# Patient Record
Sex: Female | Born: 1960 | Race: Black or African American | Hispanic: No | Marital: Married | Smoking: Never smoker
Health system: Southern US, Community
[De-identification: ages and names within clinical notes are randomized; demographics above are authoritative.]

## PROBLEM LIST (undated history)

## (undated) DIAGNOSIS — I10 Essential (primary) hypertension: Secondary | ICD-10-CM

## (undated) DIAGNOSIS — E669 Obesity, unspecified: Secondary | ICD-10-CM

## (undated) DIAGNOSIS — M1990 Unspecified osteoarthritis, unspecified site: Secondary | ICD-10-CM

## (undated) DIAGNOSIS — G4730 Sleep apnea, unspecified: Secondary | ICD-10-CM

## (undated) DIAGNOSIS — G4733 Obstructive sleep apnea (adult) (pediatric): Secondary | ICD-10-CM

## (undated) HISTORY — DX: Essential (primary) hypertension: I10

## (undated) HISTORY — DX: Sleep apnea, unspecified: G47.30

## (undated) HISTORY — DX: Unspecified osteoarthritis, unspecified site: M19.90

## (undated) HISTORY — DX: Obesity, unspecified: E66.9

## (undated) HISTORY — DX: Obstructive sleep apnea (adult) (pediatric): G47.33

## (undated) HISTORY — PX: TUBAL LIGATION: SHX77

---

## 2001-05-15 ENCOUNTER — Encounter: Admission: RE | Admit: 2001-05-15 | Discharge: 2001-05-15 | Payer: Self-pay | Admitting: Family Medicine

## 2001-05-15 ENCOUNTER — Encounter: Payer: Self-pay | Admitting: Family Medicine

## 2001-10-02 ENCOUNTER — Encounter: Payer: Self-pay | Admitting: Family Medicine

## 2001-10-02 ENCOUNTER — Encounter: Admission: RE | Admit: 2001-10-02 | Discharge: 2001-10-02 | Payer: Self-pay | Admitting: Family Medicine

## 2002-01-12 ENCOUNTER — Observation Stay (HOSPITAL_COMMUNITY): Admission: EM | Admit: 2002-01-12 | Discharge: 2002-01-13 | Payer: Self-pay | Admitting: *Deleted

## 2002-01-12 ENCOUNTER — Encounter: Payer: Self-pay | Admitting: *Deleted

## 2002-01-13 ENCOUNTER — Encounter: Payer: Self-pay | Admitting: Interventional Cardiology

## 2004-02-28 DIAGNOSIS — G4733 Obstructive sleep apnea (adult) (pediatric): Secondary | ICD-10-CM

## 2004-02-28 HISTORY — DX: Obstructive sleep apnea (adult) (pediatric): G47.33

## 2004-08-01 ENCOUNTER — Emergency Department (HOSPITAL_COMMUNITY): Admission: EM | Admit: 2004-08-01 | Discharge: 2004-08-01 | Payer: Self-pay | Admitting: Family Medicine

## 2004-08-18 ENCOUNTER — Ambulatory Visit: Payer: Self-pay | Admitting: Internal Medicine

## 2004-09-02 ENCOUNTER — Ambulatory Visit: Payer: Self-pay | Admitting: Internal Medicine

## 2004-09-12 ENCOUNTER — Ambulatory Visit (HOSPITAL_BASED_OUTPATIENT_CLINIC_OR_DEPARTMENT_OTHER): Admission: RE | Admit: 2004-09-12 | Discharge: 2004-09-12 | Payer: Self-pay | Admitting: Internal Medicine

## 2004-09-18 ENCOUNTER — Ambulatory Visit: Payer: Self-pay | Admitting: Internal Medicine

## 2004-09-19 ENCOUNTER — Encounter: Admission: RE | Admit: 2004-09-19 | Discharge: 2004-12-18 | Payer: Self-pay | Admitting: Internal Medicine

## 2004-10-17 ENCOUNTER — Ambulatory Visit (HOSPITAL_BASED_OUTPATIENT_CLINIC_OR_DEPARTMENT_OTHER): Admission: RE | Admit: 2004-10-17 | Discharge: 2004-10-17 | Payer: Self-pay | Admitting: Internal Medicine

## 2004-10-23 ENCOUNTER — Ambulatory Visit: Payer: Self-pay | Admitting: Internal Medicine

## 2004-12-14 ENCOUNTER — Ambulatory Visit: Payer: Self-pay | Admitting: Internal Medicine

## 2005-01-09 ENCOUNTER — Encounter (INDEPENDENT_AMBULATORY_CARE_PROVIDER_SITE_OTHER): Payer: Self-pay | Admitting: *Deleted

## 2005-01-09 ENCOUNTER — Ambulatory Visit: Payer: Self-pay | Admitting: Internal Medicine

## 2005-02-06 ENCOUNTER — Encounter (INDEPENDENT_AMBULATORY_CARE_PROVIDER_SITE_OTHER): Payer: Self-pay | Admitting: *Deleted

## 2005-02-06 ENCOUNTER — Ambulatory Visit (HOSPITAL_COMMUNITY): Admission: RE | Admit: 2005-02-06 | Discharge: 2005-02-06 | Payer: Self-pay | Admitting: Sports Medicine

## 2005-02-06 LAB — HM MAMMOGRAPHY: HM Mammogram: NORMAL

## 2005-04-13 ENCOUNTER — Ambulatory Visit: Payer: Self-pay | Admitting: Internal Medicine

## 2005-07-05 ENCOUNTER — Ambulatory Visit: Payer: Self-pay | Admitting: Internal Medicine

## 2005-08-21 ENCOUNTER — Ambulatory Visit: Payer: Self-pay | Admitting: Internal Medicine

## 2005-08-29 ENCOUNTER — Ambulatory Visit: Payer: Self-pay | Admitting: Internal Medicine

## 2005-12-27 DIAGNOSIS — I10 Essential (primary) hypertension: Secondary | ICD-10-CM

## 2006-02-04 DIAGNOSIS — J309 Allergic rhinitis, unspecified: Secondary | ICD-10-CM | POA: Insufficient documentation

## 2006-06-28 ENCOUNTER — Emergency Department (HOSPITAL_COMMUNITY): Admission: EM | Admit: 2006-06-28 | Discharge: 2006-06-28 | Payer: Self-pay | Admitting: Family Medicine

## 2006-10-18 ENCOUNTER — Ambulatory Visit: Payer: Self-pay | Admitting: *Deleted

## 2006-10-18 ENCOUNTER — Ambulatory Visit (HOSPITAL_COMMUNITY): Admission: RE | Admit: 2006-10-18 | Discharge: 2006-10-18 | Payer: Self-pay | Admitting: *Deleted

## 2006-10-18 LAB — CONVERTED CEMR LAB
BUN: 11 mg/dL (ref 6–23)
CO2: 25 meq/L (ref 19–32)
Calcium: 9.6 mg/dL (ref 8.4–10.5)
Chloride: 102 meq/L (ref 96–112)
Creatinine, Ser: 0.78 mg/dL (ref 0.40–1.20)
TSH: 2.183 microintl units/mL (ref 0.350–5.50)

## 2006-11-06 ENCOUNTER — Encounter (INDEPENDENT_AMBULATORY_CARE_PROVIDER_SITE_OTHER): Payer: Self-pay | Admitting: *Deleted

## 2006-11-06 ENCOUNTER — Ambulatory Visit (HOSPITAL_COMMUNITY): Admission: RE | Admit: 2006-11-06 | Discharge: 2006-11-06 | Payer: Self-pay | Admitting: *Deleted

## 2006-12-19 ENCOUNTER — Ambulatory Visit: Payer: Self-pay | Admitting: *Deleted

## 2006-12-19 LAB — CONVERTED CEMR LAB
Chloride: 100 meq/L (ref 96–112)
Potassium: 3.6 meq/L (ref 3.5–5.3)
Sodium: 135 meq/L (ref 135–145)

## 2007-04-10 ENCOUNTER — Ambulatory Visit: Payer: Self-pay | Admitting: *Deleted

## 2008-06-16 ENCOUNTER — Encounter (INDEPENDENT_AMBULATORY_CARE_PROVIDER_SITE_OTHER): Payer: Self-pay | Admitting: Internal Medicine

## 2008-06-23 ENCOUNTER — Ambulatory Visit: Payer: Self-pay | Admitting: Internal Medicine

## 2008-06-23 ENCOUNTER — Encounter (INDEPENDENT_AMBULATORY_CARE_PROVIDER_SITE_OTHER): Payer: Self-pay | Admitting: Internal Medicine

## 2008-06-23 ENCOUNTER — Ambulatory Visit (HOSPITAL_COMMUNITY): Admission: RE | Admit: 2008-06-23 | Discharge: 2008-06-23 | Payer: Self-pay | Admitting: Internal Medicine

## 2008-06-23 ENCOUNTER — Encounter (INDEPENDENT_AMBULATORY_CARE_PROVIDER_SITE_OTHER): Payer: Self-pay | Admitting: *Deleted

## 2008-06-23 LAB — CONVERTED CEMR LAB
ALT: 16 units/L (ref 0–35)
AST: 28 units/L (ref 0–37)
Albumin: 3.7 g/dL (ref 3.5–5.2)
CK-MB: 3.2 ng/mL (ref 0.3–4.0)
Calcium: 9.1 mg/dL (ref 8.4–10.5)
Chloride: 104 meq/L (ref 96–112)
Cholesterol: 150 mg/dL (ref 0–200)
Creatinine, Ser: 0.69 mg/dL (ref 0.40–1.20)
MCHC: 32.6 g/dL (ref 30.0–36.0)
MCV: 84.9 fL (ref 78.0–100.0)
Potassium: 3.8 meq/L (ref 3.5–5.3)
Pro B Natriuretic peptide (BNP): <30 pg/mL (ref 0.0–100.0)
RBC: 4.36 M/uL (ref 3.87–5.11)
Relative Index: 1.7 (ref 0.0–2.5)
Sodium: 137 meq/L (ref 135–145)
TSH: 2.322 microintl units/mL (ref 0.350–4.500)
VLDL: 14 mg/dL (ref 0–40)

## 2008-06-24 ENCOUNTER — Encounter (INDEPENDENT_AMBULATORY_CARE_PROVIDER_SITE_OTHER): Payer: Self-pay | Admitting: *Deleted

## 2008-06-29 ENCOUNTER — Encounter: Payer: Self-pay | Admitting: Internal Medicine

## 2008-06-29 ENCOUNTER — Ambulatory Visit: Payer: Self-pay | Admitting: Vascular Surgery

## 2008-06-29 ENCOUNTER — Ambulatory Visit (HOSPITAL_COMMUNITY): Admission: RE | Admit: 2008-06-29 | Discharge: 2008-06-29 | Payer: Self-pay | Admitting: Internal Medicine

## 2008-11-25 ENCOUNTER — Ambulatory Visit: Payer: Self-pay | Admitting: Internal Medicine

## 2008-11-25 LAB — CONVERTED CEMR LAB
Calcium: 9.3 mg/dL (ref 8.4–10.5)
Chloride: 105 meq/L (ref 96–112)
Creatinine, Ser: 0.83 mg/dL (ref 0.40–1.20)

## 2009-11-01 ENCOUNTER — Emergency Department (HOSPITAL_COMMUNITY): Admission: EM | Admit: 2009-11-01 | Discharge: 2009-11-01 | Payer: Self-pay | Admitting: Family Medicine

## 2009-11-01 ENCOUNTER — Emergency Department (HOSPITAL_COMMUNITY): Admission: EM | Admit: 2009-11-01 | Discharge: 2009-11-01 | Payer: Self-pay | Admitting: Emergency Medicine

## 2009-11-01 ENCOUNTER — Encounter: Payer: Self-pay | Admitting: Internal Medicine

## 2009-11-03 ENCOUNTER — Ambulatory Visit: Payer: Self-pay | Admitting: Internal Medicine

## 2009-11-04 ENCOUNTER — Encounter: Payer: Self-pay | Admitting: Internal Medicine

## 2009-11-05 ENCOUNTER — Encounter: Payer: Self-pay | Admitting: Internal Medicine

## 2009-11-05 LAB — CONVERTED CEMR LAB
Chloride: 102 meq/L (ref 96–112)
Potassium: 3.6 meq/L (ref 3.5–5.3)
Sodium: 139 meq/L (ref 135–145)

## 2009-12-28 ENCOUNTER — Telehealth: Payer: Self-pay | Admitting: *Deleted

## 2010-03-20 ENCOUNTER — Encounter: Payer: Self-pay | Admitting: *Deleted

## 2010-03-29 NOTE — Miscellaneous (Signed)
  Clinical Lists Changes   Orders: Added new Service order of Est. Patient Level III (99213) - Signed 

## 2010-03-29 NOTE — Progress Notes (Signed)
Summary: flu shot/ hla  Phone Note Call from Patient   Summary of Call: pt calls and states she got her flu shot 12/23/2009, [facility] [employer]. Initial call taken by: Marin Roberts RN,  December 28, 2009 11:10 AM

## 2010-03-29 NOTE — Assessment & Plan Note (Signed)
Summary: FU/SB.   Vital Signs:  Patient profile:   50 year old female Height:      71 inches (180.34 cm) Weight:      272.3 pounds (123.77 kg) BMI:     38.12 Temp:     98.3 degrees F (36.83 degrees C) oral Pulse rate:   79 / minute Pulse (ortho):   91 / minute BP supine:   120 / 80 BP sitting:   110 / 75  (left arm) BP standing:   109 / 72 Cuff size:   large  Vitals Entered By: Theotis Barrio NT II (November 03, 2009 11:02 AM)  Serial Vital Signs/Assessments:  Time      Position  BP       Pulse  Resp  Temp     By           Lying RA  120/80   79                    Kaye Goldston,CMA (AAMA)           Sitting   110/75   83                    Kaye Goldston,CMA (AAMA)           Standing  109/72   91                    Kaye Goldston,CMA (AAMA)  Comments: a little off balance sitting to standing By: Cynda Familia (AAMA)   CC: PATIENT IS TO GET FLU SHOT FREE AT WORK  /  HERE FOR ER FOLLOW UP APPT /  Is Patient Diabetic? Yes Pain Assessment Patient in pain? no      Nutritional Status BMI of > 30 = obese  Have you ever been in a relationship where you felt threatened, hurt or afraid?No   Does patient need assistance? Functional Status Self care Ambulation Normal Comments TO GET FLU SHOT FREE AT HER WORK  /  ER FOLLOW UP APPT   Primary Care Provider:  Blanch Media MD  CC:  PATIENT IS TO GET FLU SHOT FREE AT WORK  /  HERE FOR ER FOLLOW UP APPT / .  History of Present Illness: This is a 50 year old female with PMH consistent with Hypertension, Osteoarthritis, Obesity who presents after ED visit on 11/01/09 for dizziness, palpation and pre-syncope episode. While in ED cardiac enzymes, CXR, CBC, Bmet and EKG were unremarkable. She noted she has been in a lot of stress at work ( works at the [employer] in [place]) and feels exhausted. She also noted occasional palpation with and without dizziness. She did not have any pre-syncopial episodes since discharged from the  ED but mentioned he had some few episodes where she felt dizzy and noted some tightness in the chest. She denies any SOB, chestpain, fevers, feeling unusal hot or cold, diarrhea, constipation, nausea.  BP today on presentation 130/83, currently on HCTZ 25 once a day BP orthostatic lying: 120/80 p 79 sitting: 110/75 p 83 standing 109/72 p 91      Preventive Screening-Counseling & Management  Alcohol-Tobacco     Alcohol type: Wine or Brandy sometimes     Smoking Status: never  Caffeine-Diet-Exercise     Does Patient Exercise: no  Current Medications (verified): 1)  Hydrochlorothiazide 25 Mg Tabs (Hydrochlorothiazide) .... Take 1 Tablet By Mouth Once A Day 2)  Eq Omeprazole  20 Mg Tbec (Omeprazole) .... Take 1 Tablet By Mouth Once A Day 3)  Vicodin 5-500 Mg Tabs (Hydrocodone-Acetaminophen) .... Take Once Daily As Needed For Pain 4)  Viagra 50 Mg Tabs (Sildenafil Citrate) .... Take 1 Hr Prior To Sexual Activity  Allergies: No Known Drug Allergies  Review of Systems  The patient denies weight loss, weight gain, vision loss, chest pain, dyspnea on exertion, peripheral edema, headaches, abdominal pain, melena, and muscle weakness.         some indegestion/heartburn  Physical Exam  General:  Well-developed,well-nourished,in no acute distress; alert,appropriate and cooperative throughout examination Neck:  No deformities, masses, or tenderness noted. Lungs:  Normal respiratory effort, chest expands symmetrically. Lungs are clear to auscultation, no crackles or wheezes. Heart:  Normal rate and regular rhythm. S1 and S2 normal without gallop, murmur, click, rub or other extra sounds. Abdomen:  Bowel sounds positive,abdomen soft and non-tender without masses, organomegaly or hernias noted. Pulses:  R and L carotid,radial,dorsalis pedis and posterior tibial pulses are full and equal bilaterally Extremities:  No clubbing, cyanosis, edema, or deformity noted with normal full range of  motion of all joints.   Neurologic:  No cranial nerve deficits noted. Station and gait are normal. Plantar reflexes are down-going bilaterally. DTRs are symmetrical throughout. Sensory, motor and coordinative functions appear intact. Cervical Nodes:  No lymphadenopathy noted Psych:  Cognition and judgment appear intact. Alert and cooperative with normal attention span and concentration. No apparent delusions, illusions, hallucinations   Impression & Recommendations:  Problem # 1:  DIZZINESS (ICD-780.4)  Palpitation, dizziness and pre-syncope episodes: DD includes vasovagal, orthostatic hypotension, volume depletion with dehydration, anemia, electrolyte abnormalities, Hyperthyroidism (especially with history of palpation). Orthostatic BP has been normal, CBC was within normal limits, Bmet was normal except potassium of 3.4, ECG and cardiac enzymes were normal in the ED. Possible cause at the time of presentation to ED was volume depletion due to dehydration and stress. Recommend to reduce stress level as much as possible and increase fluid intake. Advised to follow up as soon as possible if symptoms persist otherwise follow up in three month.  We will consider to check TSH.  Orders: T-TSH ([id number])  Problem # 2:  HYPERTENSION (ICD-401.9) BP has been well controlled. 130/83  with puls of 88 and after rechecking 110/75  with a puls of 83. At this point I would not make any changes in her medication. I recommended to check it on a regular basis at home. Recheck Bmet since potassium was 3.4 and taking HCTZ.  Her updated medication list for this problem includes:    Hydrochlorothiazide 25 Mg Tabs (Hydrochlorothiazide) .Marland Kitchen... Take 1 tablet by mouth once a day  Orders: T-Basic Metabolic Panel [phone])  Problem # 3:  OSTEOARTHRITIS (ICD-715.90) She complaines about occasional pain especially in the knee. If the pain is severe she takes a vicodin which relieves the pain. Refilled her  prescription today.  Her updated medication list for this problem includes:    Vicodin 5-500 Mg Tabs (Hydrocodone-acetaminophen) .Marland Kitchen... Take once daily as needed for pain  Complete Medication List: 1)  Hydrochlorothiazide 25 Mg Tabs (Hydrochlorothiazide) .... Take 1 tablet by mouth once a day 2)  Eq Omeprazole 20 Mg Tbec (Omeprazole) .... Take 1 tablet by mouth once a day 3)  Vicodin 5-500 Mg Tabs (Hydrocodone-acetaminophen) .... Take once daily as needed for pain 4)  Viagra 50 Mg Tabs (Sildenafil citrate) .... Take 1 hr prior to sexual activity  Patient Instructions: 1)  Please schedule  a follow-up appointment in 3 months. Prescriptions: VICODIN 5-500 MG TABS (HYDROCODONE-ACETAMINOPHEN) take once daily as needed for pain  #30 x 1   Entered and Authorized by:   Almyra Deforest MD   Signed by:   Almyra Deforest MD on 11/03/2009   Method used:   Print then Give to Patient   RxID:   [id number] EQ OMEPRAZOLE 20 MG TBEC (OMEPRAZOLE) Take 1 tablet by mouth once a day  #30 x 5   Entered and Authorized by:   Almyra Deforest MD   Signed by:   Almyra Deforest MD on 11/03/2009   Method used:   Electronically to        [facility] [id number] (retail)       [address]       [place], [place]  [address]       Ph: [phone]       Fax: [phone]   RxID:   [id number]  Process Orders Check Orders Results:     [facility]: ABN not required for this insurance Tests Sent for requisitioning (November 03, 2009 1:46 PM):     11/03/2009: [facility] -- T-Basic Metabolic Panel [phone] (signed)     11/03/2009: [facility] -- T-TSH [phone] (signed)     Prevention & Chronic Care Immunizations   Influenza vaccine: Not documented    Tetanus booster: Not documented    Pneumococcal vaccine: Not documented  Colorectal Screening   Hemoccult: Not documented    Colonoscopy: Not documented  Other  Screening   Pap smear: Not documented    Mammogram: Normal  (02/06/2005)   Smoking status: never  (11/03/2009)  Lipids   Total Cholesterol: 150  (06/23/2008)   LDL: 85  (06/23/2008)   LDL Direct: Not documented   HDL: 51  (06/23/2008)   Triglycerides: 71  (06/23/2008)  Hypertension   Last Blood Pressure: 110 / 75  (11/03/2009)   Serum creatinine: 0.83  (11/25/2008)   Serum potassium 4.5  (11/25/2008)  Self-Management Support :   Personal Goals (by the next clinic visit) :      Personal blood pressure goal: 130/80  (11/03/2009)   Patient will work on the following items until the next clinic visit to reach self-care goals:     Medications and monitoring: take my medicines every day  (11/03/2009)     Eating: drink diet soda or water instead of juice or soda, eat more vegetables, eat foods that are low in salt, eat baked foods instead of fried foods, eat fruit for snacks and desserts, limit or avoid alcohol  (11/03/2009)    Hypertension self-management support: Resources for patients handout  (11/03/2009)      Resource handout printed.

## 2010-03-29 NOTE — Discharge Summary (Signed)
Summary: Emergency Department Discharge Update    Hospital Discharge Update:  Date of Admission: 11/01/2009 Date of Discharge: 11/01/2009  Brief Summary:  Patient was transferred from UC secondary to palpitations, dizziness and pre-syncope episodes. Symptoms has been present for the last week or so, but worse over the last 2-3 days. Patient reports a lot of stress at work and also increased in her duty hours due to short staff. (per husband that is the reason of her symptoms); she admitted exhaustion feeling over the last days due to too much work. While in ED Cardiac enzymes, CXR, CBC, BMET and EKG were unremarkable. The decision was to discharge her home with a close followup and further assessment of her symptoms as an outpatient. Patient will call the office tomorrow to arrange visit as her earliest convenience.  Lab or other results pending at discharge:  None  Other labs needed at follow-up: Would be helpful to check TSH and repeat BMET.  Other follow-up issues:  Patient BP has been not well controlled recently and she might need adjustment on her current medications or a new added drug for better control.  The medication, problem, and allergy lists have been updated.  Please see the dictated discharge summary for details.  Discharge medications:  HYDROCHLOROTHIAZIDE 25 MG TABS (HYDROCHLOROTHIAZIDE) Take 1 tablet by mouth once a day EQ OMEPRAZOLE 20 MG TBEC (OMEPRAZOLE) Take 1 tablet by mouth once a day VICODIN 5-500 MG TABS (HYDROCODONE-ACETAMINOPHEN) take once daily as needed for pain VIAGRA 50 MG TABS (SILDENAFIL CITRATE) take 1 hr prior to sexual activity  Other patient instructions:  1- Call the office tomorrow in order to schedule followup visit. 2- Follow a low sodium diet and keep yourself hydrated. 3- Take life easy and try not to overwhelmed yourself. 4- Take your medications as prescribed. 5- Labwork, EKG and image studies performed in the ED today were in normal  limits.  Note: Hospital Discharge Medications & Other Instructions handout was printed, one copy for patient and a second copy to be placed in hospital chart.

## 2010-03-29 NOTE — Letter (Signed)
Summary: Out of Work  [facility]  [address]   [place], [place] [address]   Phone: [phone]  Fax: [phone]    November 01, 2009   Employee:  JENNI THEW    To Whom It May Concern:     For Medical reasons, please excuse the above named employee from work for the following dates:  Start:   11/01/09  End:   11/03/09  If you need additional information, please feel free to contact our office.            Sincerely,    Vassie Loll MD

## 2010-04-22 ENCOUNTER — Encounter: Payer: Self-pay | Admitting: Internal Medicine

## 2010-04-22 DIAGNOSIS — E669 Obesity, unspecified: Secondary | ICD-10-CM

## 2010-04-22 DIAGNOSIS — J309 Allergic rhinitis, unspecified: Secondary | ICD-10-CM

## 2010-04-22 DIAGNOSIS — I10 Essential (primary) hypertension: Secondary | ICD-10-CM

## 2010-04-22 DIAGNOSIS — Z6837 Body mass index (BMI) 37.0-37.9, adult: Secondary | ICD-10-CM | POA: Insufficient documentation

## 2010-04-22 DIAGNOSIS — G4733 Obstructive sleep apnea (adult) (pediatric): Secondary | ICD-10-CM

## 2010-04-22 DIAGNOSIS — M1990 Unspecified osteoarthritis, unspecified site: Secondary | ICD-10-CM

## 2010-05-12 LAB — DIFFERENTIAL
Eosinophils Relative: 1 % (ref 0–5)
Lymphocytes Relative: 42 % (ref 12–46)
Lymphs Abs: 2.2 10*3/uL (ref 0.7–4.0)
Monocytes Absolute: 0.3 10*3/uL (ref 0.1–1.0)

## 2010-05-12 LAB — BASIC METABOLIC PANEL
Chloride: 103 mEq/L (ref 96–112)
GFR calc Af Amer: >60 mL/min (ref >60)
Potassium: 3.4 mEq/L — ABNORMAL LOW (ref 3.5–5.1)
Sodium: 139 mEq/L (ref 135–145)

## 2010-05-12 LAB — POCT CARDIAC MARKERS
CKMB, poc: 1.3 ng/mL (ref 1.0–8.0)
Troponin i, poc: <0.05 ng/mL (ref 0.00–0.09)

## 2010-05-12 LAB — CBC
HCT: 38.6 % (ref 36.0–46.0)
MCV: 86.5 fL (ref 78.0–100.0)
RBC: 4.46 MIL/uL (ref 3.87–5.11)
WBC: 5.2 10*3/uL (ref 4.0–10.5)

## 2010-07-15 NOTE — Procedures (Signed)
NAME:  Emily Schmitt, Emily Schmitt                ACCOUNT NO.:  [id number]   MEDICAL RECORD NO.:  [id number]          PATIENT TYPE:  OUT   LOCATION:  SLEEP CENTER                 FACILITY:  [facility]   PHYSICIAN:  Clinton D. Maple Hudson, M.D. DATE OF BIRTH:  1960-05-14   DATE OF STUDY:  09/12/2004                              NOCTURNAL POLYSOMNOGRAM   REFERRING PHYSICIAN:  Dr. Duncan Dull   DATE OF STUDY:  September 12, 2004   INDICATION FOR STUDY:  Hypersomnia with sleep apnea.  Epworth Sleepiness  Score 9/24, BMI 43, weight 310 pounds.   SLEEP ARCHITECTURE:  Total sleep time 425 minutes with sleep efficiency 96%.  Stage I 5%, stage II 56%, stages III and IV 4%, REM 35% of total sleep time.  Sleep latency 7 minutes, REM latency 43 minutes, awake after sleep onset 10  minutes, arousal index 30.  No bedtime medication taken.   RESPIRATORY DATA:  Split-study protocol.  Respiratory Disturbance Index  (RDI, AHI) 145.6 obstructive events per hour indicating very severe  obstructive sleep apnea/hypopnea syndrome before CPAP.  There were 313  obstructive apneas, 1 central apnea, 1 mixed apnea and 4 hypopneas before  CPAP.  She slept only supine.  REM RDI 34.  CPAP was titrated to 11 CWP, RDI  7.1 per hour, using a small Respironics ComfortGel Nasal Mask with heated  humidifier.   OXYGEN DATA:  Moderately loud snoring with oxygen desaturation to a nadir of  62% before CPAP.  After CPAP control saturation held 94-96% on room air.   CARDIAC DATA:  Normal sinus rhythm.   MOVEMENT/PARASOMNIA:  Very frequent limb jerks but only a few were  associated with sleep disturbance, probably insignificant but can be  reevaluated if necessary after sleep apnea is treated.   IMPRESSION/RECOMMENDATION:  1.  Very severe obstructive sleep apnea/hypopnea syndrome, Respiratory      Disturbance Index 145.6 per hour, with moderately loud snoring and      oxygen desaturation to 62%.  2.  Successful continuous positive airway pressure  titration to 11 CWP,      Respiratory Disturbance Index 7.1 per hour, using a small Respironics      ComfortGel Nasal Mask and heated humidifier.      Clinton D. Maple Hudson, M.D.  Diplomat    CDY/MEDQ  D:  09/18/2004 11:08:58  T:  09/18/2004 22:59:13  Job:  [id number]

## 2010-07-15 NOTE — Procedures (Signed)
NAME:  Emily Schmitt, Emily Schmitt                ACCOUNT NO.:  [id number]   MEDICAL RECORD NO.:  [id number]          PATIENT TYPE:  OUT   LOCATION:  SLEEP CENTER                 [facility]ACILITY:  [facility]   PHYSICIAN:  Clinton D. Maple Hudson, M.D. DATE OF BIRTH:  07-02-1960   DATE OF STUDY:  10/17/2004                              NOCTURNAL POLYSOMNOGRAM   REFERRING PHYSICIAN:  Dr. Duncan Dull   DATE OF STUDY:  October 17, 2004   INDICATION FOR STUDY:  Hypersomnia with sleep apnea. Epworth Sleepiness  Score 9/24, BMI 43, weight 310 pounds. A baseline diagnostic study was done  September 12, 2004 recording an RDI of 145 per hour. Split protocol CPAP  titration on that study night to a CPAP of 11 CWP gave an RDI of 7.1 per  hour. Repeat CPAP titration is requested.   SLEEP ARCHITECTURE:  Total sleep time 372 minutes with sleep efficiency 98%.  Stage I was 1%, stage II 47%, stages III and IV were absent, REM 52% of  total sleep time indicating rebound. Sleep latency was 6 minutes, REM  latency 33 minutes, awake after sleep onset 1.5 minutes, arousal index 8. No  bedtime medication recorded.   RESPIRATORY DATA:  CPAP titration to 14 CWP, AHI 0.7 per hour using a medium  ComfortGel Mask with heated humidifier.   OXYGEN DATA:  Baseline snoring was very loud. Snoring was eliminated at a  CPAP pressure of 14 CWP with oxygen saturation on CPAP 95-97% room air.   CARDIAC DATA:  Normal sinus rhythm.   MOVEMENT/PARASOMNIA:  Occasional leg jerks with little effect on sleep.   IMPRESSION/RECOMMENDATION:  1.  Successful continuous positive airway pressure titration to 14 CWP,      apnea-hypopnea index 0.7 per hour using a medium ComfortGel Mask with      heated humidifier.  2.  Baseline diagnostic NPSG on September 12, 2004 had recorded an apnea-hypopnea      index of 145 per hour which is unusually severe.  3.  Rapid eye movement rebound indicating successful correction of impaired      sleep quality is noted on the current  study.      Clinton D. Maple Hudson, M.D.  Diplomate, Biomedical engineer of Sleep Medicine  Electronically Signed     CDY/MEDQ  D:  10/23/2004 09:37:52  T:  10/23/2004 14:21:18  Job:  [id number]

## 2010-07-15 NOTE — H&P (Signed)
NAME:  Emily Schmitt, Emily Schmitt                          ACCOUNT NO.:  [id number]   MEDICAL RECORD NO.:  [id number]                   PATIENT TYPE:  INP   LOCATION:  2019                                 FACILITY:  [facility]   PHYSICIAN:  Lesleigh Noe, M.D.            DATE OF BIRTH:  08/24/1960   DATE OF ADMISSION:  01/12/2002  DATE OF DISCHARGE:                                HISTORY & PHYSICAL   SUBJECTIVE:  The patient is 50 years of age and has a history of  hypertension.  She was admitted because of a 2-3 day history of recurring  stress/emotion-related right and precordial chest discomfort (and ache),  associated with some numbness and tingling in the right arm.  The discomfort  is not associated with dyspnea, diaphoresis, exertion, or other complaints.  The duration of the discomfort is usually less than 10 minutes.   HABITS:  Negative smoking, positive social ETOH.   PAST MEDICAL HISTORY:  1. Hypertension.  2. Obesity.   FAMILY HISTORY:  Sister has hole in the heart.  Mother has diabetes.   ALLERGIES:  None.   MEDICATIONS:  None.  Was on antihypertensive therapy but discontinued this.   REVIEW OF SYSTEMS:  Positive for occasional reflux-like symptoms.  Negative  neurological symptoms.  Negative cervical and lower back discomfort.  Weight  has been stable.  No chills, no fever.  Exertional tolerance is adequate.  She works in housekeeping at [employer] and is able to do her  job without physical limitations.   OBJECTIVE:  VITAL SIGNS:  Blood pressure 160/70, heart rate 80, weight 267  pounds, respirations 16 and nonlabored, and afebrile.  SKIN:  Clear.  No obvious conjunctival pallor or other skin abnormalities.  HEENT:  Unremarkable.  Extraocular movements are full.  NECK:  No JVD, carotid bruits, or thyromegaly.  LUNGS:  Clear to auscultation and percussion.  CARDIAC EXAM:  No click, no rub, no murmur, no gallop.  ABDOMEN:  Soft.  Liver and spleen are not  palpable.  Bowel sounds are  normal.  EXTREMITIES:  No edema.  Pulses are 2+ and symmetric in the upper and lower  extremities.  NEUROLOGICAL:  Normal.  No motor or sensory deficits are noted.   DATA:  An EKG is normal.  Potassium 3.3.  CK-MB negative x1.  Troponin-I  negative x2.  Chest x-ray:  Result pending.   ASSESSMENT:  1. Chest discomfort, atypical.  The patient has risk factor including     obesity and hypertension.  Myocardial infarction has been ruled out.     Rule out coronary artery disease.  2. Obesity.  3. History of hypertension, not currently on therapy.  4. Hypokalemia.   PLAN:  1. Adenosine Cardiolite.  2. Replete potassium.  3. Will likely need to resume antihypertensive therapy.  4. Further workup as an outpatient if Cardiolite is negative.  Lesleigh Noe, M.D.    HWS/MEDQ  D:  01/13/2002  T:  01/13/2002  Job:  [id number]   cc:   Milbert Coulter, M.D.  [facility]  [place]

## 2012-01-10 ENCOUNTER — Emergency Department (HOSPITAL_COMMUNITY): Payer: BC Managed Care – PPO

## 2012-01-10 ENCOUNTER — Encounter (HOSPITAL_COMMUNITY): Payer: Self-pay | Admitting: Emergency Medicine

## 2012-01-10 ENCOUNTER — Observation Stay (HOSPITAL_COMMUNITY)
Admission: EM | Admit: 2012-01-10 | Discharge: 2012-01-11 | Disposition: A | Discharge status: Home / Self Care | Payer: BC Managed Care – PPO | Attending: Emergency Medicine | Admitting: Emergency Medicine

## 2012-01-10 DIAGNOSIS — R0602 Shortness of breath: Secondary | ICD-10-CM | POA: Insufficient documentation

## 2012-01-10 DIAGNOSIS — R0789 Other chest pain: Secondary | ICD-10-CM | POA: Diagnosis present

## 2012-01-10 DIAGNOSIS — I498 Other specified cardiac arrhythmias: Secondary | ICD-10-CM | POA: Insufficient documentation

## 2012-01-10 DIAGNOSIS — I10 Essential (primary) hypertension: Secondary | ICD-10-CM | POA: Insufficient documentation

## 2012-01-10 DIAGNOSIS — IMO0002 Reserved for concepts with insufficient information to code with codable children: Secondary | ICD-10-CM | POA: Insufficient documentation

## 2012-01-10 DIAGNOSIS — I471 Supraventricular tachycardia: Secondary | ICD-10-CM | POA: Diagnosis not present

## 2012-01-10 DIAGNOSIS — M549 Dorsalgia, unspecified: Secondary | ICD-10-CM | POA: Insufficient documentation

## 2012-01-10 DIAGNOSIS — Z6837 Body mass index (BMI) 37.0-37.9, adult: Secondary | ICD-10-CM | POA: Diagnosis present

## 2012-01-10 DIAGNOSIS — G4733 Obstructive sleep apnea (adult) (pediatric): Secondary | ICD-10-CM | POA: Diagnosis present

## 2012-01-10 DIAGNOSIS — Z79899 Other long term (current) drug therapy: Secondary | ICD-10-CM | POA: Insufficient documentation

## 2012-01-10 DIAGNOSIS — Z8709 Personal history of other diseases of the respiratory system: Secondary | ICD-10-CM | POA: Insufficient documentation

## 2012-01-10 DIAGNOSIS — R079 Chest pain, unspecified: Principal | ICD-10-CM | POA: Insufficient documentation

## 2012-01-10 DIAGNOSIS — E669 Obesity, unspecified: Secondary | ICD-10-CM | POA: Insufficient documentation

## 2012-01-10 DIAGNOSIS — Z8669 Personal history of other diseases of the nervous system and sense organs: Secondary | ICD-10-CM | POA: Insufficient documentation

## 2012-01-10 DIAGNOSIS — M1710 Unilateral primary osteoarthritis, unspecified knee: Secondary | ICD-10-CM | POA: Insufficient documentation

## 2012-01-10 LAB — POCT I-STAT TROPONIN I
Troponin i, poc: 0 ng/mL (ref 0.00–0.08)
Troponin i, poc: 0 ng/mL (ref 0.00–0.08)
Troponin i, poc: 0 ng/mL (ref 0.00–0.08)

## 2012-01-10 LAB — BASIC METABOLIC PANEL
BUN: 14 mg/dL (ref 6–23)
CO2: 29 mEq/L (ref 19–32)
Calcium: 9.8 mg/dL (ref 8.4–10.5)
Chloride: 100 mEq/L (ref 96–112)
Creatinine, Ser: 0.68 mg/dL (ref 0.50–1.10)
GFR calc Af Amer: >90 mL/min (ref >90)
GFR calc non Af Amer: >90 mL/min (ref >90)
Glucose, Bld: 119 mg/dL — ABNORMAL HIGH (ref 70–99)
Potassium: 4.1 mEq/L (ref 3.5–5.1)
Sodium: 137 mEq/L (ref 135–145)

## 2012-01-10 LAB — CBC
HCT: 40.2 % (ref 36.0–46.0)
Hemoglobin: 13.8 g/dL (ref 12.0–15.0)
RDW: 13.3 % (ref 11.5–15.5)
WBC: 5.2 10*3/uL (ref 4.0–10.5)

## 2012-01-10 MED ORDER — ASPIRIN EC 325 MG PO TBEC
325.0000 mg | DELAYED_RELEASE_TABLET | Freq: Once | ORAL | Status: AC
  Administered 2012-01-10: 325 mg via ORAL
  Filled 2012-01-10: qty 1

## 2012-01-10 NOTE — ED Notes (Signed)
Pt eating sub from [place] that her family brought her.

## 2012-01-10 NOTE — ED Notes (Signed)
Pt c/o midsternal CP through to back x 2 days with dizziness and SOB; pt sts worse with exertion

## 2012-01-10 NOTE — ED Provider Notes (Signed)
Medical screening examination/treatment/procedure(s) were performed by non-physician practitioner and as supervising physician I was immediately available for consultation/collaboration.   David H Yao, MD 01/10/12 2329 

## 2012-01-10 NOTE — ED Provider Notes (Signed)
History     CSN: [id number]  Arrival date & time 01/10/12  1724   First MD Initiated Contact with Patient 01/10/12 1958      Chief Complaint  Patient presents with  . Chest Pain  . Back Pain    (Consider location/radiation/quality/duration/timing/severity/associated sxs/prior treatment) The history is provided by the patient.  Emily Schmitt is a 51 y.o. female hx of HTN, obesity here with chest pain, lightheadedness, SOB. Substernal chest pain that is intermittent and lasting 15 minutes for the last 2 days. She has SOB and lightheadedness associated with the symptoms. Denies any diaphoresis. Symptoms worse with exertion. No fever or cough. Had stress test several years ago. Never smoker.    Past Medical History  Diagnosis Date  . Hypertension   . OSA (obstructive sleep apnea) 2006    Dx 7/06 with RDI 145.6 and desat to 62%. CPAP at 11 reduced RDI to 7.1. Repeat testing Aug 2006 showed RDI 0.7 on CPAP of 0.7  . Obesity   . Osteoarthritis     Of knees B. Uses hydrocodone sparingly.   . Allergic rhinitis   . Osteoarthritis     Past Surgical History  Procedure Date  . Tubal ligation     History reviewed. No pertinent family history.  History  Substance Use Topics  . Smoking status: Never Smoker   . Smokeless tobacco: Never Used  . Alcohol Use: Yes    OB History    Grav Para Term Preterm Abortions TAB SAB Ect Mult Living                  Review of Systems  Respiratory: Positive for shortness of breath.   Cardiovascular: Positive for chest pain.  All other systems reviewed and are negative.    Allergies  Review of patient's allergies indicates no known allergies.  Home Medications   Current Outpatient Rx  Name  Route  Sig  Dispense  Refill  . HYDROCHLOROTHIAZIDE 25 MG PO TABS   Oral   Take 25 mg by mouth daily.             BP 151/76  Pulse 94  Temp 98.7 F (37.1 C) (Oral)  Resp 18  SpO2 99%  LMP 12/10/2011  Physical Exam  Nursing note and  vitals reviewed. Constitutional: She is oriented to person, place, and time. She appears well-developed and well-nourished.       NAD   HENT:  Head: Normocephalic.  Mouth/Throat: Oropharynx is clear and moist.  Eyes: Conjunctivae normal are normal. Pupils are equal, round, and reactive to light.  Neck: Normal range of motion. Neck supple.  Cardiovascular: Normal rate, regular rhythm and normal heart sounds.   Pulmonary/Chest: Effort normal and breath sounds normal. No respiratory distress. She has no wheezes. She has no rales.  Abdominal: Soft. Bowel sounds are normal. She exhibits no distension. There is no tenderness. There is no rebound.  Musculoskeletal: Normal range of motion. She exhibits no edema and no tenderness.  Neurological: She is alert and oriented to person, place, and time.  Skin: Skin is warm and dry.  Psychiatric: She has a normal mood and affect. Her behavior is normal. Judgment and thought content normal.    ED Course  Procedures (including critical care time)  Labs Reviewed  BASIC METABOLIC PANEL - Abnormal; Notable for the following:    Glucose, Bld 119 (*)     All other components within normal limits  CBC  D-DIMER, QUANTITATIVE  POCT I-STAT  TROPONIN I   Dg Chest 2 View  01/10/2012  *RADIOLOGY REPORT*  Clinical Data: Chest pain, shortness of breath  CHEST - 2 VIEW  Comparison: Chest x-ray of 11/01/2009  Findings: No active infiltrate or effusion is seen.  Mediastinal contours appear stable.  The heart is within upper limits of normal and stable.  No bony abnormality is seen.  IMPRESSION: No active lung disease.   Original Report Authenticated By: Dwyane Dee, M.D.      No diagnosis found.   Date: 01/10/2012  Rate: 89  Rhythm: normal sinus rhythm  QRS Axis: right  Intervals: normal  ST/T Wave abnormalities: normal  Conduction Disutrbances:none  Narrative Interpretation:   Old EKG Reviewed: none available    MDM  [name] is a 51 y.o. female  here with chest pain. Trop neg x 1. CXR nl. Will transfer to CDU for chest pain protocol. She will get stress test in AM.          Richardean Canal, MD 01/10/12 2029

## 2012-01-10 NOTE — ED Provider Notes (Signed)
Patient arrived to the CDU for chest pain protocol. On evaluation patient reports she is currently pain-free patient reports she had some pain prior to coming over that after she was given aspirin the pain went completely away. Patient reports a past medical history of hypertension. Patient  had chest pain for the past 2 days prior to treatment in the emergency department she reports she has experienced some shortness of breath and some dizziness with exertion on physical exam. On physical exam vital signs are stable patient's blood pressure is 120/74 heart rate is 70 patient is resting comfortably she is alert and oriented patient's lungs are clear heart is a regular rate and rhythm abdomen is soft and nontender we will continue to monitor patient until a.m. stress test.   Elson Areas, PA 01/10/12 2203

## 2012-01-11 ENCOUNTER — Encounter (HOSPITAL_COMMUNITY): Payer: Self-pay | Admitting: Physician Assistant

## 2012-01-11 ENCOUNTER — Other Ambulatory Visit: Payer: Self-pay

## 2012-01-11 DIAGNOSIS — I471 Supraventricular tachycardia, unspecified: Secondary | ICD-10-CM

## 2012-01-11 DIAGNOSIS — R072 Precordial pain: Secondary | ICD-10-CM

## 2012-01-11 DIAGNOSIS — R0789 Other chest pain: Secondary | ICD-10-CM | POA: Diagnosis present

## 2012-01-11 LAB — POCT I-STAT TROPONIN I: Troponin i, poc: 0.01 ng/mL (ref 0.00–0.08)

## 2012-01-11 MED ORDER — METOPROLOL SUCCINATE ER 50 MG PO TB24: 50.0000 mg | ORAL_TABLET | Freq: Every day | ORAL | Status: DC

## 2012-01-11 NOTE — Progress Notes (Signed)
Utilization review completed.  P.J. Yandiel Bergum,RN,BSN Case Manager [phone]  

## 2012-01-11 NOTE — Consult Note (Signed)
CARDIOLOGY CONSULT NOTE   Patient ID: Emily Schmitt MRN: [id number] DOB/AGE: 1961-01-10 51 y.o.  Admit date: 01/10/2012  Primary Physician   Blanch Media, MD Primary Cardiologist   patient saw Garnette Scheuermann 10 years ago, wants to see [place] now Reason for Consultation   chest pain, SVT  Emily Schmitt is a 51 y.o. female with no history of CAD. She had palpitations and chest pain 10 years ago, was admitted overnight and had a negative stress test. In 2011, she had an episode of palpitations and near syncope, symptoms resolved and she was discharged from the emergency room. 4 days ago, she began having symptoms again. She has had multiple episodes of tachycardia palpitations that began generally while she is walking or climbing stairs. She can ameliorate the symptoms by sitting and resting. If she is not able to sit down and rest, she will develop presyncope, substernal chest pain, shortness of breath and diaphoresis. The symptoms resolve spontaneously in approximately 5 minutes or less. The episodes have increased in frequency and duration over the last few days. She had at least 5 episodes yesterday. She denies any precipitating symptoms but states that they have only occurred while she is walking or exerting herself in some way.  Ms. Shetler came to the emergency room last p.m. because she was concerned about the increasing frequency of her symptoms. She ruled out for MI with serial troponins. She was scheduled for a stress echocardiogram. During the test, she went into SVT which reproduced her symptoms. The test was completed, results are pending. After she got off the treadmill, she spontaneously converted to sinus rhythm. Valsalva or adenosine was not needed. When she converted to sinus rhythm, her chest pain decreased and finally resolved. Currently she is resting comfortably.  Past Medical History  Diagnosis Date  . Hypertension   . OSA (obstructive sleep apnea) 2006    Dx 7/06  with RDI 145.6 and desat to 62%. CPAP at 11 reduced RDI to 7.1. Repeat testing Aug 2006 showed RDI 0.7 on CPAP of 0.7  . Obesity   . Osteoarthritis     Of knees B. Uses hydrocodone sparingly.   . Allergic rhinitis   . Osteoarthritis      Past Surgical History  Procedure Date  . Tubal ligation     No Known Allergies  I have reviewed the patient's current medications    . [COMPLETED] aspirin EC  325 mg Oral Once    Medication Sig  hydrochlorothiazide 25 MG tablet Take 25 mg by mouth daily.       History   Social History  . Marital Status: Married    Spouse Name: N/A    Number of Children: N/A  . Years of Education: N/A   Occupational History  . Housekeeping supervisor [employer]   Social History Main Topics  . Smoking status: Never Smoker   . Smokeless tobacco: Never Used  . Alcohol Use: Yes - does not abuse   . Drug Use: No  . Sexually Active:    Family Status  Relation Status Death Age  . Sister Alive     Heart murmur  . Mother Deceased 33    Possible CVA, no CAD  . Father Deceased Late 28s    No cardiac issues  . Brother Alive     No cardiac issues    ROS: She does not abuse alcohol or drugs. She does not feel that she has been under an unusual amount of  stress recently. She has had no recent illnesses, fevers or chills. She does not have a low pressure cuff and does not check her blood pressure at home, but is compliant with her medications. Full 14 point review of systems complete and found to be negative unless listed above.  Physical Exam: Blood pressure 131/70, pulse 79, temperature 97.3 F (36.3 C), temperature source Oral, resp. rate 20, last menstrual period 12/10/2011, SpO2 99.00%.  General: Well developed, well nourished, female in no acute distress Head: Eyes PERRLA, No xanthomas.   Normocephalic and atraumatic, oropharynx without edema or exudate. Dentition: poor Lungs: Clear bilaterally Heart: HRRR S1 S2, no rub/gallop,no  significant murmur. pulses are 2+ all 4 extrem.   Neck: No carotid bruits. No lymphadenopathy.  JVD not elevated. Abdomen: Bowel sounds present, abdomen soft and non-tender without masses or hernias noted. Msk:  No spine or cva tenderness. No weakness, no joint deformities or effusions. Extremities: No clubbing or cyanosis. no edema.  Neuro: Alert and oriented X 3. No focal deficits noted. Psych:  Good affect, responds appropriately Skin: No rashes or lesions noted.  Labs:   Lab Results  Component Value Date   WBC 5.2 01/10/2012   HGB 13.8 01/10/2012   HCT 40.2 01/10/2012   MCV 88.0 01/10/2012   PLT 245 01/10/2012    Lab 01/10/12 1735  NA 137  K 4.1  CL 100  CO2 29  BUN 14  CREATININE 0.68  CALCIUM 9.8  PROT --  BILITOT --  ALKPHOS --  ALT --  AST --  GLUCOSE 119*    Basename 01/11/12 0226 01/10/12 2346  TROPIPOC 0.01 0.00   Lab Results  Component Value Date   DDIMER <0.27 01/10/2012    Myoview: 01/13/2002 IMPRESSION 1. BREAST ATTENUATION OF THE ANTERIOR WALL. NO EVIDENCE OF INFARCTION OR  ISCHEMIA. 2. ESTIMATED EJECTION FRACTION 63%.  Echo: 11/06/2006 SUMMARY - Overall left ventricular systolic function was normal. Left ventricular ejection fraction was estimated to be 60 %. Left ventricular wall thickness was mildly increased. - There was mild mitral valvular regurgitation. - Pericardial fat pad noted.  ECG:  10-Jan-2012 17:29:01 [facility]/ED ROUTINE RECORD Normal sinus rhythm Rightward axis Borderline ECG 12mm/s 67mm/mV 100Hz  8.0.1 12SL 241 HD CID: 3 Referred by: Unconfirmed Vent. rate 89 BPM PR interval 144 ms QRS duration 78 ms QT/QTc 368/447 ms P-R-T axes 68 92 29  Radiology:  Dg Chest 2 View 01/10/2012  *RADIOLOGY REPORT*  Clinical Data: Chest pain, shortness of breath  CHEST - 2 VIEW  Comparison: Chest x-ray of 11/01/2009  Findings: No active infiltrate or effusion is seen.  Mediastinal contours appear stable.  The heart  is within upper limits of normal and stable.  No bony abnormality is seen.  IMPRESSION: No active lung disease.   Original Report Authenticated By: Dwyane Dee, M.D.     ASSESSMENT AND PLAN:   The patient was seen today by Dr Shirlee Latch, the patient evaluated and the data reviewed.  Principal Problem:  *SVT (supraventricular tachycardia) - she has had a recent onset of symptoms with multiple episodes. She had recurrence of SVT with associated chest pain and shortness of breath while on the treadmill. Her symptoms resolve spontaneously. M.D. advise on admission, versus initiation of beta blocker and outpatient monitoring.  Active Problems:  Chest pain, mid sternal - this only occurs in the setting of palpitations. She had ruled out for MI, but has multiple cardiac risk factors. M.D. review ECGs/images from stress testing and advise  if further workup needed.   HYPERTENSION- sub-optimal control, would benefit from addition of BB.   OSA (obstructive sleep apnea) - continue CPAP   Obesity - Encourage heart-healthy diet.  Signed: Theodore Demark 01/11/2012, 10:17 AM Co-Sign MD  Patient seen with PA, agree with the above note.  I reviewed her stress echo.  There was no evidence for ischemia or infarction on echo images but she did go into a regular SVT with a rate around 200 during exercise.  This spontaneously converted back to NSR.  Her symptoms prior to admission were heart racing with associated lightheadedness and mild chest pain.  She had multiple episodes over the last few days.  She had similar symptoms about 2 years ago but nothing was ever diagnosed.  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  - Start Toprol XL 50 mg daily (BP is high at baseline).  - Avoid caffeine (drinks 2 cups of coffee and several soft drinks daily).  - Check TSH.  - I will put her on a 3 wk event monitor.  If she continues to have symptomatic SVT despite Toprol  XL use, will need to consider EP consult for ablation.  - I will see her in the office in a couple of weeks.   Marca Ancona 01/11/2012 10:39 AM

## 2012-01-11 NOTE — Progress Notes (Signed)
  Echocardiogram Echocardiogram Stress Test has been performed.  Emily Schmitt 01/11/2012, 10:49 AM

## 2012-01-11 NOTE — ED Notes (Signed)
MD at bedside. 

## 2012-01-11 NOTE — ED Notes (Signed)
Pt is back in room from Stress Test

## 2012-01-11 NOTE — ED Notes (Addendum)
Spoke with Echo/ stated that they were on their way to get patient for test

## 2012-01-11 NOTE — ED Notes (Addendum)
Pt transported to Echo / husband and friend is waiting for patient in her room

## 2012-01-11 NOTE — ED Provider Notes (Signed)
8:11 AM Handoff from Dr. Nicanor Alcon.    Patient on CPP, plan for exercise stress test.   Patient not in room at current time, she is getting stress test. Labs reviewed from overnight. Neg trop x 4.   9:34 AM Patient returned from stress echo. Received report patient had SVT during test and it was stopped. Upon return to CDU, patient in NSR rate 95-100.    Date: 01/11/2012  Rate: 94  Rhythm: normal sinus rhythm  QRS Axis: normal  Intervals: normal  ST/T Wave abnormalities: nonspecific T wave changes  Conduction Disutrbances:none  Narrative Interpretation:   Old EKG Reviewed: unchanged  Spoke with cardiology PA. Awaiting consult.   10:27 AM Dr. Shirlee Latch has seen. Will d/c to home on metoprolol, event monitor. Patient to follow-up in office.   Renne Crigler, [place] 01/11/12 1255

## 2012-01-12 NOTE — ED Provider Notes (Signed)
Medical screening examination/treatment/procedure(s) were performed by non-physician practitioner and as supervising physician I was immediately available for consultation/collaboration.  Damaso Laday R. Olita Takeshita, MD 01/12/12 1058 

## 2012-01-15 ENCOUNTER — Telehealth: Payer: Self-pay | Admitting: Cardiology

## 2012-01-15 DIAGNOSIS — I471 Supraventricular tachycardia: Secondary | ICD-10-CM

## 2012-01-15 NOTE — Telephone Encounter (Signed)
Pt should go ahead get event monitor prior to appt with Dr Shirlee Latch 02/16/12. Pt advised, verbalized understanding.

## 2012-01-15 NOTE — Telephone Encounter (Signed)
Pt calling re heart monitor, she didn't know if she was get it now or when she see's him 12-20, pls call

## 2012-01-16 DIAGNOSIS — I471 Supraventricular tachycardia: Secondary | ICD-10-CM

## 2012-02-08 ENCOUNTER — Encounter: Payer: Self-pay | Admitting: *Deleted

## 2012-02-16 ENCOUNTER — Ambulatory Visit (INDEPENDENT_AMBULATORY_CARE_PROVIDER_SITE_OTHER): Payer: BC Managed Care – PPO | Admitting: Cardiology

## 2012-02-16 ENCOUNTER — Encounter: Payer: Self-pay | Admitting: Cardiology

## 2012-02-16 VITALS — BP 146/94 | HR 96 | Ht 71.5 in | Wt 311.2 lb

## 2012-02-16 DIAGNOSIS — I498 Other specified cardiac arrhythmias: Secondary | ICD-10-CM

## 2012-02-16 DIAGNOSIS — I471 Supraventricular tachycardia: Secondary | ICD-10-CM

## 2012-02-16 MED ORDER — METOPROLOL SUCCINATE ER 50 MG PO TB24: 50.0000 mg | ORAL_TABLET | Freq: Every day | ORAL | Status: DC

## 2012-02-16 NOTE — Patient Instructions (Addendum)
Your physician wants you to follow-up in: 6 months with Dr McLean. (June 2014). You will receive a reminder letter in the mail two months in advance. If you don't receive a letter, please call our office to schedule the follow-up appointment.  

## 2012-02-19 NOTE — Progress Notes (Signed)
Patient ID: Emily Schmitt, female   DOB: [birth year], 51 y.o.   MRN: [id number] PCP: Dr. Rogelia Boga  51 yo with history of HTN presents for evaluation of SVT.  Patient developed a sensation of racing heart rate associated with chest tightness in 11/13.  She went to the ER and was in NSR but symptoms had resolved.  She had a stress echo the next day.  There was no evidence for ischemia or infarction, but while on the treadmill, she developed regular SVT that reproduced her prior symptoms.  She was started on Toprol XL and has had no symptomatic recurrence.  She is avoiding caffeine.  No exertional dyspnea or chest pain. TSH was normal.  She wore a 3 week monitor while on Toprol XL.  No significant arrhythmias were noted.   ECG: NSR, LAE, QTc 477 msec  Labs (11/13): TSH normal, K 4.1, creatinine 0.68  PMH: 1. SVT 2. HTN 3. OSA 4. OA knees 5. Obesity 6. Stress echo 11/13 with no evidence for ischemia or infarction.   FH: Mother with CVA  SH: Nonsmoker.  Married.  Works as Advertising copywriter at [employer].   ROS: All systems reviewed and negative except as per HPI.   Current Outpatient Prescriptions  Medication Sig Dispense Refill  . hydrochlorothiazide 25 MG tablet Take 25 mg by mouth daily.        . metoprolol succinate (TOPROL XL) 50 MG 24 hr tablet Take 1 tablet (50 mg total) by mouth daily. Take with or immediately following a meal.  30 tablet  11    BP 146/94  Pulse 96  Ht 5' 11.5" (1.816 m)  Wt 311 lb 3.2 oz (141.159 kg)  BMI 42.80 kg/m2  SpO2 97% General: NAD, obese.  Neck: No JVD, no thyromegaly or thyroid nodule.  Lungs: Clear to auscultation bilaterally with normal respiratory effort. CV: Nondisplaced PMI.  Heart regular S1/S2, no S3/S4, no murmur.  No peripheral edema.  No carotid bruit.  Normal pedal pulses.  Abdomen: Soft, nontender, no hepatosplenomegaly, no distention.  Neurologic: Alert and oriented x 3.  Psych: Normal affect. Extremities: No clubbing or  cyanosis.   Assessment/Plan: 1. 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  No symptomatic recurrence since 11/13.  Also, she did not have significant arrhythmias while wearing the event monitor.  I would continue Toprol XL at 50 mg daily. If she has symptomatic recurrence while on medication, could consider EP ablation.  Continue to avoid caffeine.   2. HTN: BP mildly elevated in the office today.  Continue current regimen.   3. Chest pain: Chest pain in 11/13 was likely due to SVT.     Marca Ancona 02/19/2012

## 2012-09-25 ENCOUNTER — Encounter: Payer: Self-pay | Admitting: Internal Medicine

## 2012-12-07 ENCOUNTER — Emergency Department (HOSPITAL_COMMUNITY)
Admission: EM | Admit: 2012-12-07 | Discharge: 2012-12-07 | Disposition: A | Discharge status: Home / Self Care | Payer: BC Managed Care – PPO | Source: Home / Self Care | Attending: Family Medicine | Admitting: Family Medicine

## 2012-12-07 ENCOUNTER — Encounter (HOSPITAL_COMMUNITY): Payer: Self-pay | Admitting: Emergency Medicine

## 2012-12-07 DIAGNOSIS — M5432 Sciatica, left side: Secondary | ICD-10-CM

## 2012-12-07 DIAGNOSIS — M5430 Sciatica, unspecified side: Secondary | ICD-10-CM

## 2012-12-07 MED ORDER — METHYLPREDNISOLONE ACETATE 40 MG/ML IJ SUSP
80.0000 mg | Freq: Once | INTRAMUSCULAR | Status: AC
  Administered 2012-12-07: 80 mg via INTRAMUSCULAR

## 2012-12-07 MED ORDER — METHYLPREDNISOLONE ACETATE 80 MG/ML IJ SUSP
INTRAMUSCULAR | Status: AC
  Filled 2012-12-07: qty 1

## 2012-12-07 MED ORDER — KETOROLAC TROMETHAMINE 30 MG/ML IJ SOLN
30.0000 mg | Freq: Once | INTRAMUSCULAR | Status: AC
  Administered 2012-12-07: 30 mg via INTRAMUSCULAR

## 2012-12-07 MED ORDER — KETOROLAC TROMETHAMINE 30 MG/ML IJ SOLN
INTRAMUSCULAR | Status: AC
  Filled 2012-12-07: qty 1

## 2012-12-07 MED ORDER — METHYLPREDNISOLONE 4 MG PO KIT: PACK | ORAL | Status: DC

## 2012-12-07 NOTE — ED Notes (Signed)
Pt  Reports  Pain  Down  l  Leg         Which  Starts         Up  High  In  The  Pelvic  Area and  Radiates  Downward   denys  Any  specefic   Injury        Ambulated  To  Room  With        Slow  Yet  Steady  Gait

## 2012-12-07 NOTE — ED Notes (Signed)
Pt  Also reports     Symptoms  Of  Vaginal  Bleeding   As well      Which  She has  Had  For  A  while

## 2012-12-07 NOTE — ED Provider Notes (Signed)
CSN: [id number]     Arrival date & time 12/07/12  1147 History   First MD Initiated Contact with Patient 12/07/12 1307     Chief Complaint  Patient presents with  . Leg Pain   (Consider location/radiation/quality/duration/timing/severity/associated sxs/prior Treatment) Patient is a 52 y.o. female presenting with leg pain. The history is provided by the patient.  Leg Pain Location:  Leg Time since incident:  2 months Injury: no   Leg location:  L upper leg and L lower leg Pain details:    Quality:  Shooting and sharp   Radiates to:  Back   Severity:  Moderate   Onset quality:  Sudden   Timing:  Intermittent   Progression:  Unchanged Chronicity:  Chronic Dislocation: no   Prior injury to area:  No Associated symptoms: back pain   Associated symptoms: no decreased ROM, no fever, no muscle weakness and no numbness   Risk factors: obesity     Past Medical History  Diagnosis Date  . Hypertension   . OSA (obstructive sleep apnea) 2006    Dx 7/06 with RDI 145.6 and desat to 62%. CPAP at 11 reduced RDI to 7.1. Repeat testing Aug 2006 showed RDI 0.7 on CPAP of 0.7  . Obesity   . Osteoarthritis     Of knees B. Uses hydrocodone sparingly.   . Allergic rhinitis   . Osteoarthritis    Past Surgical History  Procedure Laterality Date  . Tubal ligation     Family History  Problem Relation Age of Onset  . Heart murmur Sister   . CVA Mother    History  Substance Use Topics  . Smoking status: Never Smoker   . Smokeless tobacco: Never Used  . Alcohol Use: Yes   OB History   Grav Para Term Preterm Abortions TAB SAB Ect Mult Living                 Review of Systems  Constitutional: Negative.  Negative for fever.  Musculoskeletal: Positive for back pain. Negative for gait problem, joint swelling and myalgias.    Allergies  Review of patient's allergies indicates no known allergies.  Home Medications   Current Outpatient Rx  Name  Route  Sig  Dispense  Refill  .  hydrochlorothiazide 25 MG tablet   Oral   Take 25 mg by mouth daily.           . methylPREDNISolone (MEDROL DOSEPAK) 4 MG tablet      follow package directions, start on Sunday, take until finished.   21 tablet   0   . metoprolol succinate (TOPROL XL) 50 MG 24 hr tablet   Oral   Take 1 tablet (50 mg total) by mouth daily. Take with or immediately following a meal.   30 tablet   11    BP 169/99  Pulse 76  Temp(Src) 98.4 F (36.9 C) (Oral)  Resp 17  SpO2 100%  LMP 11/30/2012 Physical Exam  Nursing note and vitals reviewed. Constitutional: She is oriented to person, place, and time. She appears well-developed and well-nourished. No distress.  Neck: Normal range of motion. Neck supple.  Abdominal: Soft. Bowel sounds are normal.  Musculoskeletal: She exhibits tenderness.  Left gluteal soreness to deep palpation, neg slr, nl ehl, nl sensation.reflexes brisk.  Neurological: She is alert and oriented to person, place, and time.  Skin: Skin is warm and dry.    ED Course  Procedures (including critical care time) Labs Review Labs Reviewed -  No data to display Imaging Review No results found.    MDM   1. Sciatica neuralgia, left       Linna Hoff, MD 12/07/12 1341

## 2012-12-10 ENCOUNTER — Encounter (HOSPITAL_COMMUNITY): Payer: Self-pay | Admitting: *Deleted

## 2012-12-10 ENCOUNTER — Inpatient Hospital Stay (HOSPITAL_COMMUNITY)
Admission: AD | Admit: 2012-12-10 | Discharge: 2012-12-10 | Disposition: A | Discharge status: Home / Self Care | Payer: BC Managed Care – PPO | Source: Ambulatory Visit | Attending: Obstetrics & Gynecology | Admitting: Obstetrics & Gynecology

## 2012-12-10 DIAGNOSIS — R109 Unspecified abdominal pain: Secondary | ICD-10-CM | POA: Insufficient documentation

## 2012-12-10 DIAGNOSIS — N949 Unspecified condition associated with female genital organs and menstrual cycle: Secondary | ICD-10-CM | POA: Insufficient documentation

## 2012-12-10 DIAGNOSIS — A5901 Trichomonal vulvovaginitis: Secondary | ICD-10-CM | POA: Insufficient documentation

## 2012-12-10 DIAGNOSIS — N938 Other specified abnormal uterine and vaginal bleeding: Secondary | ICD-10-CM | POA: Insufficient documentation

## 2012-12-10 DIAGNOSIS — A599 Trichomoniasis, unspecified: Secondary | ICD-10-CM

## 2012-12-10 LAB — URINALYSIS, ROUTINE W REFLEX MICROSCOPIC
Bilirubin Urine: NEGATIVE
Glucose, UA: NEGATIVE mg/dL
Ketones, ur: 15 mg/dL — AB
Leukocytes, UA: NEGATIVE
Protein, ur: NEGATIVE mg/dL
Urobilinogen, UA: 1 mg/dL (ref 0.0–1.0)
pH: 6 (ref 5.0–8.0)

## 2012-12-10 LAB — CBC
HCT: 39.3 % (ref 36.0–46.0)
Hemoglobin: 13 g/dL (ref 12.0–15.0)
MCH: 29.3 pg (ref 26.0–34.0)
MCHC: 33.1 g/dL (ref 30.0–36.0)

## 2012-12-10 LAB — WET PREP, GENITAL
Clue Cells Wet Prep HPF POC: NONE SEEN
Yeast Wet Prep HPF POC: NONE SEEN

## 2012-12-10 LAB — URINE MICROSCOPIC-ADD ON

## 2012-12-10 LAB — POCT PREGNANCY, URINE: Preg Test, Ur: NEGATIVE

## 2012-12-10 MED ORDER — HYDROCHLOROTHIAZIDE 25 MG PO TABS: 25.0000 mg | ORAL_TABLET | Freq: Every day | ORAL | Status: DC

## 2012-12-10 MED ORDER — ONDANSETRON 4 MG PO TBDP
4.0000 mg | ORAL_TABLET | Freq: Once | ORAL | Status: AC
  Administered 2012-12-10: 4 mg via ORAL
  Filled 2012-12-10: qty 1

## 2012-12-10 MED ORDER — METRONIDAZOLE 500 MG PO TABS
2000.0000 mg | ORAL_TABLET | Freq: Once | ORAL | Status: AC
  Administered 2012-12-10: 2000 mg via ORAL
  Filled 2012-12-10: qty 4

## 2012-12-10 NOTE — MAU Provider Note (Signed)
History     CSN: [id number]  Arrival date and time: 12/10/12 1254   None     Chief Complaint  Patient presents with  . Abdominal Pain   HPI  Ms. Emily Schmitt is a 52 y.o. female; G2P2002, non pregnant female who presents to be evaluated for left sided abdominal pain; she feels like the pain is coming from her left ovary. She was seen Sunday at the [facility] ED for sciatica pain, at that time she was having vaginal bleeding and was told to follow up at [facility] hospital. For the past several months she has been having irregular vaginal bleeding; menstrual cycle coming around every 2 weeks, some days heavy and some days light. She is not currently bleeding, and currently rates her abdominal pain 1/10. She is married and is not concerned about an STI.   OB History   Grav Para Term Preterm Abortions TAB SAB Ect Mult Living   2 2 2       2       Past Medical History  Diagnosis Date  . Hypertension   . OSA (obstructive sleep apnea) 2006    Dx 7/06 with RDI 145.6 and desat to 62%. CPAP at 11 reduced RDI to 7.1. Repeat testing Aug 2006 showed RDI 0.7 on CPAP of 0.7  . Obesity   . Osteoarthritis     Of knees B. Uses hydrocodone sparingly.   . Allergic rhinitis   . Osteoarthritis     Past Surgical History  Procedure Laterality Date  . Tubal ligation      Family History  Problem Relation Age of Onset  . Heart murmur Sister   . CVA Mother     History  Substance Use Topics  . Smoking status: Never Smoker   . Smokeless tobacco: Never Used  . Alcohol Use: Yes    Allergies: No Known Allergies  Prescriptions prior to admission  Medication Sig Dispense Refill  . methylPREDNISolone (MEDROL DOSEPAK) 4 MG tablet follow package directions, start on Sunday, take until finished.  21 tablet  0  . metoprolol succinate (TOPROL XL) 50 MG 24 hr tablet Take 1 tablet (50 mg total) by mouth daily. Take with or immediately following a meal.  30 tablet  11  . naproxen sodium (ANAPROX) 220 MG  tablet Take 440 mg by mouth 2 (two) times daily with a meal.       Results for orders placed during the hospital encounter of 12/10/12 (from the past 24 hour(s))  URINALYSIS, ROUTINE W REFLEX MICROSCOPIC     Status: Abnormal   Collection Time    12/10/12  1:00 PM      Result Value Range   Color, Urine YELLOW  YELLOW   APPearance HAZY (*) CLEAR   Specific Gravity, Urine >1.030 (*) 1.005 - 1.030   pH 6.0  5.0 - 8.0   Glucose, UA NEGATIVE  NEGATIVE mg/dL   Hgb urine dipstick LARGE (*) NEGATIVE   Bilirubin Urine NEGATIVE  NEGATIVE   Ketones, ur 15 (*) NEGATIVE mg/dL   Protein, ur NEGATIVE  NEGATIVE mg/dL   Urobilinogen, UA 1.0  0.0 - 1.0 mg/dL   Nitrite NEGATIVE  NEGATIVE   Leukocytes, UA NEGATIVE  NEGATIVE  URINE MICROSCOPIC-ADD ON     Status: Abnormal   Collection Time    12/10/12  1:00 PM      Result Value Range   Squamous Epithelial / LPF MANY (*) RARE   RBC / HPF 0-2  <3 RBC/hpf  Bacteria, UA FEW (*) RARE   Crystals CA OXALATE CRYSTALS (*) NEGATIVE   Urine-Other TRICHOMONAS PRESENT    POCT PREGNANCY, URINE     Status: None   Collection Time    12/10/12  1:51 PM      Result Value Range   Preg Test, Ur NEGATIVE  NEGATIVE  WET PREP, GENITAL     Status: Abnormal   Collection Time    12/10/12  2:14 PM      Result Value Range   Yeast Wet Prep HPF POC NONE SEEN  NONE SEEN   Trich, Wet Prep FEW (*) NONE SEEN   Clue Cells Wet Prep HPF POC NONE SEEN  NONE SEEN   WBC, Wet Prep HPF POC FEW (*) NONE SEEN  CBC     Status: None   Collection Time    12/10/12  2:23 PM      Result Value Range   WBC 6.5  4.0 - 10.5 K/uL   RBC 4.43  3.87 - 5.11 MIL/uL   Hemoglobin 13.0  12.0 - 15.0 g/dL   HCT 46.9  62.9 - 52.8 %   MCV 88.7  78.0 - 100.0 fL   MCH 29.3  26.0 - 34.0 pg   MCHC 33.1  30.0 - 36.0 g/dL   RDW 41.3  24.4 - 01.0 %   Platelets 294  150 - 400 K/uL    Review of Systems  Constitutional: Negative for fever and chills.  Gastrointestinal: Positive for abdominal pain.   Musculoskeletal: Positive for joint pain.   Physical Exam   Blood pressure 181/88, pulse 71, temperature 97.8 F (36.6 C), temperature source Oral, resp. rate 18, height 5\' 11"  (1.803 m), weight 324 lb 3.2 oz (147.056 kg), last menstrual period 11/30/2012, SpO2 100.00%.  Physical Exam  Constitutional: She is oriented to person, place, and time. She appears well-developed and well-nourished. No distress.  HENT:  Head: Normocephalic.  Neck: Neck supple.  GI: Soft. She exhibits no distension. There is no tenderness. There is no rebound.  Genitourinary: Vaginal discharge found.  Speculum exam: Vagina - moderate amount of creamy brown discharge, strong odor Cervix - No contact bleeding Bimanual exam: Cervix closed, no CMT  Uterus non tender, normal size Adnexa non tender, no masses bilaterally GC/Chlam, wet prep done Chaperone present for exam.   Neurological: She is oriented to person, place, and time.  Skin: Skin is warm and dry. She is not diaphoretic.    MAU Course  Procedures None  MDM UA Wet prep GC-Chlamydia- pending Referral to GYN clinic    Assessment and Plan   A: Abnormal vaginal bleeding Trichomonas vaginitis   P: Discharge home  Restart your BP medication as soon as possible  Zofran 4 gm in MAU Flagyl 2 gram in MAU Pt asked me to refill her HCTZ RX: HCTZ 25 mg PO daily (#30) 1rf Referral to GYN clinic; they will call you to schedule appointment.  You have been diagnosed with a sexually transmitted disease.  You need to be treated and your partner(s) will need to be treated.  No sex until 10 days after you finished your medicine and no sex until 10 days after your partner has taken their medication. I have provided a phone number for [facility]; please call them to schedule an appointment to follow up for BP management.   RASCH, JENNIFER IRENE FNP-C 12/10/2012, 3:14 PM

## 2012-12-10 NOTE — MAU Note (Addendum)
Patient presents to MAU with c/o of sharp Left lower sided pain that started last week and has "gotten better" after starting Medrol DosePak prescribed on 10/11 at [facility] visit. Patient states "still concerned something is wrong. I want to be checked out."  Denies any pain at this time nor bleeding.

## 2012-12-10 NOTE — MAU Provider Note (Signed)
Attestation of Attending Supervision of Advanced Practitioner (CNM/NP): Evaluation and management procedures were performed by the Advanced Practitioner under my supervision and collaboration.  I have reviewed the Advanced Practitioner's note and chart, and I agree with the management and plan.  HARRAWAY-SMITH, Vergene Marland 7:25 PM     

## 2012-12-11 LAB — GC/CHLAMYDIA PROBE AMP: CT Probe RNA: NEGATIVE

## 2013-01-09 ENCOUNTER — Ambulatory Visit (INDEPENDENT_AMBULATORY_CARE_PROVIDER_SITE_OTHER): Payer: BC Managed Care – PPO | Admitting: Nurse Practitioner

## 2013-01-09 ENCOUNTER — Encounter: Payer: Self-pay | Admitting: Nurse Practitioner

## 2013-01-09 VITALS — BP 139/73 | HR 84 | Temp 97.5°F | Ht 71.0 in | Wt 318.7 lb

## 2013-01-09 DIAGNOSIS — I498 Other specified cardiac arrhythmias: Secondary | ICD-10-CM

## 2013-01-09 DIAGNOSIS — I471 Supraventricular tachycardia: Secondary | ICD-10-CM

## 2013-01-09 DIAGNOSIS — R1032 Left lower quadrant pain: Secondary | ICD-10-CM

## 2013-01-09 DIAGNOSIS — Z01419 Encounter for gynecological examination (general) (routine) without abnormal findings: Secondary | ICD-10-CM

## 2013-01-09 DIAGNOSIS — N949 Unspecified condition associated with female genital organs and menstrual cycle: Secondary | ICD-10-CM

## 2013-01-09 DIAGNOSIS — N938 Other specified abnormal uterine and vaginal bleeding: Secondary | ICD-10-CM | POA: Insufficient documentation

## 2013-01-09 DIAGNOSIS — I10 Essential (primary) hypertension: Secondary | ICD-10-CM

## 2013-01-09 MED ORDER — METOPROLOL SUCCINATE ER 50 MG PO TB24: 50.0000 mg | ORAL_TABLET | Freq: Every day | ORAL | Status: DC

## 2013-01-09 MED ORDER — HYDROCHLOROTHIAZIDE 25 MG PO TABS: 25.0000 mg | ORAL_TABLET | Freq: Every day | ORAL | Status: DC

## 2013-01-09 NOTE — Patient Instructions (Signed)

## 2013-01-09 NOTE — Progress Notes (Signed)
History:  Emily Schmitt is a 52 y.o. G2P2002 who presents to clinic today for pains in her left lower quad for the last 3 months. She also has irregular vaginal bleeding as often as every 2 weeks. Her H/H= 13 and 39. 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 She was treated for this. Her last pap smear was 4 years ago. All of her pap smears were normal. She has also been experiencing hot flashes for several years. She has several other health issues that she was referred to [facility] and she is awaiting an appointment. She is afraid her blood pressure medication will run out before she gets in to be seen.  The following portions of the patient's history were reviewed and updated as appropriate: allergies, current medications, past family history, past medical history, past social history, past surgical history and problem list.  Review of Systems:  Pertinent items are noted in HPI.  Objective:  Physical Exam BP 139/73  Pulse 84  Temp(Src) 97.5 F (36.4 C)  Ht 5\' 11"  (1.803 m)  Wt 318 lb 11.2 oz (144.561 kg)  BMI 44.47 kg/m2  LMP 11/26/2012 GENERAL: Well-developed, well-nourished female in no acute distress.  HEENT: Normocephalic, atraumatic.  NECK: Supple. Normal thyroid.  LUNGS: Normal rate. Clear to auscultation bilaterally.  HEART: Regular rate and rhythm with no adventitious sounds.  BREASTS: Symmetric in size. No masses, skin changes, nipple drainage, or lymphadenopathy. ABDOMEN: Soft, nontender, nondistended. No organomegaly. Normal bowel sounds appreciated in all quadrants.  PELVIC: Normal external female genitalia. Vagina is pink and rugated.  Normal discharge. Normal cervix contour no lesions seen. Pap smear obtained.  Difficult to assess her uterus and ovaries due to her size. No obvious mass appreciated EXTREMITIES: No cyanosis, clubbing, or edema, 2+ distal pulses.   Labs and Imaging No results found.  Assessment & Plan:   Assessment:  DUB LLQ pain Hypertension  Plans:  Obtain Pap smear, pelvic Ultrasound and FSH today Pt will RTC in 4 weeks for follow up BP Meds were refilled  Carolynn Serve, NP 01/09/2013 5:29 PM

## 2013-01-10 LAB — FOLLICLE STIMULATING HORMONE: FSH: 16 m[IU]/mL

## 2013-01-10 LAB — WET PREP, GENITAL
WBC, Wet Prep HPF POC: NONE SEEN
Yeast Wet Prep HPF POC: NONE SEEN

## 2013-01-15 ENCOUNTER — Ambulatory Visit (HOSPITAL_COMMUNITY)
Admission: RE | Admit: 2013-01-15 | Discharge: 2013-01-15 | Disposition: A | Discharge status: Home / Self Care | Payer: BC Managed Care – PPO | Source: Ambulatory Visit | Attending: Nurse Practitioner | Admitting: Nurse Practitioner

## 2013-01-15 DIAGNOSIS — D251 Intramural leiomyoma of uterus: Secondary | ICD-10-CM | POA: Insufficient documentation

## 2013-01-15 DIAGNOSIS — N949 Unspecified condition associated with female genital organs and menstrual cycle: Secondary | ICD-10-CM | POA: Insufficient documentation

## 2013-01-15 DIAGNOSIS — N938 Other specified abnormal uterine and vaginal bleeding: Secondary | ICD-10-CM | POA: Insufficient documentation

## 2013-02-03 ENCOUNTER — Encounter: Payer: Self-pay | Admitting: Nurse Practitioner

## 2013-02-03 ENCOUNTER — Ambulatory Visit (INDEPENDENT_AMBULATORY_CARE_PROVIDER_SITE_OTHER): Payer: BC Managed Care – PPO | Admitting: Nurse Practitioner

## 2013-02-03 VITALS — BP 142/83 | HR 91 | Ht 71.5 in

## 2013-02-03 DIAGNOSIS — N949 Unspecified condition associated with female genital organs and menstrual cycle: Secondary | ICD-10-CM

## 2013-02-03 DIAGNOSIS — D259 Leiomyoma of uterus, unspecified: Secondary | ICD-10-CM

## 2013-02-03 DIAGNOSIS — D219 Benign neoplasm of connective and other soft tissue, unspecified: Secondary | ICD-10-CM

## 2013-02-03 DIAGNOSIS — N938 Other specified abnormal uterine and vaginal bleeding: Secondary | ICD-10-CM

## 2013-02-03 MED ORDER — PROMETHAZINE HCL 25 MG PO TABS: 25.0000 mg | ORAL_TABLET | Freq: Four times a day (QID) | ORAL | Status: DC | PRN

## 2013-02-03 MED ORDER — OXYCODONE-ACETAMINOPHEN 5-325 MG PO TABS: 1.0000 | ORAL_TABLET | ORAL | Status: DC | PRN

## 2013-02-03 NOTE — Progress Notes (Signed)
History:  Emily Schmitt a 52 y.o. [id number] who presents to clinic today for follow up on vaginal bleeding and pain. She had a negative pap, FSH= 16 , and trich negative. Pelvic ultrasound was positive for fibroids. She is given options of medications verses surgery. She would like to think about her options  The following portions of the patient's history were reviewed and updated as appropriate: allergies, current medications, past family history, past medical history, past social history, past surgical history and problem list.  Review of Systems:  Pertinent items are noted in HPI.  Objective:  Physical Exam BP 142/83  Pulse 91  Ht 5' 11.5" (1.816 m)  LMP 01/20/2013 GENERAL: Well-developed, well-nourished female in no acute distress.  HEENT: Normocephalic, atraumatic. Marland Kitchen  EXTREMITIES: No cyanosis, clubbing, or edema, 2+ distal pulses.   Labs and Imaging US Transvaginal Non-ob  01/15/2013   CLINICAL DATA:  Dysfunctional uterine bleeding for 3 months.  EXAM: TRANSABDOMINAL AND TRANSVAGINAL ULTRASOUND OF PELVIS  TECHNIQUE: Both transabdominal and transvaginal ultrasound examinations of the pelvis were performed. Transabdominal technique was performed for global imaging of the pelvis including uterus, ovaries, adnexal regions, and pelvic cul-de-sac. It was necessary to proceed with endovaginal exam following the transabdominal exam to visualize the uterus, endometrium and ovaries.  COMPARISON:  None  FINDINGS: Uterus  Measurements: 9.9 x 6.9 x 6.1 cm. Intramural fibroid in the posterior myometrium measures 4.6 x 2.9 x 3.5 cm. Intramural fibroid within the anterior myometrium measures 1.7 x 2.0 x 2.2 cm.  Endometrium  Thickness: 5.7 mm.  No focal abnormality visualized.  Right ovary  Measurements: 2.8 x 1.1 x 1.7 cm. Normal appearance/no adnexal mass.  Left ovary  Measurements: 1.5 x 1.6 x 2.0 cm. Normal appearance/no adnexal mass.  Other findings  No free fluid.  IMPRESSION: 1. Fibroid uterus.  2. Endometrium measures 5.7 mm. Which, in a premenopausal female, is within normal limits.   Electronically Signed   By: Signa Kell M.D.   On: 01/15/2013 15:48   US Pelvis Complete  01/15/2013   CLINICAL DATA:  Dysfunctional uterine bleeding for 3 months.  EXAM: TRANSABDOMINAL AND TRANSVAGINAL ULTRASOUND OF PELVIS  TECHNIQUE: Both transabdominal and transvaginal ultrasound examinations of the pelvis were performed. Transabdominal technique was performed for global imaging of the pelvis including uterus, ovaries, adnexal regions, and pelvic cul-de-sac. It was necessary to proceed with endovaginal exam following the transabdominal exam to visualize the uterus, endometrium and ovaries.  COMPARISON:  None  FINDINGS: Uterus  Measurements: 9.9 x 6.9 x 6.1 cm. Intramural fibroid in the posterior myometrium measures 4.6 x 2.9 x 3.5 cm. Intramural fibroid within the anterior myometrium measures 1.7 x 2.0 x 2.2 cm.  Endometrium  Thickness: 5.7 mm.  No focal abnormality visualized.  Right ovary  Measurements: 2.8 x 1.1 x 1.7 cm. Normal appearance/no adnexal mass.  Left ovary  Measurements: 1.5 x 1.6 x 2.0 cm. Normal appearance/no adnexal mass.  Other findings  No free fluid.  IMPRESSION: 1. Fibroid uterus. 2. Endometrium measures 5.7 mm. Which, in a premenopausal female, is within normal limits.   Electronically Signed   By: Signa Kell M.D.   On: 01/15/2013 15:48    Assessment & Plan:  Assessment:  Uterine Fibroids  Plans:  Medication management with Depo Provera verses surgery Percocet/ phenergan for severe pain RTC as needed  Carolynn Serve, NP 02/03/2013 4:37 PM

## 2013-02-03 NOTE — Patient Instructions (Signed)
Fibroids Fibroids are lumps (tumors) that can occur any place in a woman's body. These lumps are not cancerous. Fibroids vary in size, weight, and where they grow. HOME CARE  Do not take aspirin.  Write down the number of pads or tampons you use during your period. Tell your doctor. This can help determine the best treatment for you. GET HELP RIGHT AWAY IF:  You have pain in your lower belly (abdomen) that is not helped with medicine.  You have cramps that are not helped with medicine.  You have more bleeding between or during your period.  You feel lightheaded or pass out (faint).  Your lower belly pain gets worse. MAKE SURE YOU:  Understand these instructions.  Will watch your condition.  Will get help right away if you are not doing well or get worse. Document Released: 03/18/2010 Document Revised: 05/08/2011 Document Reviewed: 03/18/2010 [employer] Patient Information 2014 [employer].  

## 2013-02-27 ENCOUNTER — Other Ambulatory Visit: Payer: Self-pay | Admitting: Cardiology

## 2013-03-13 ENCOUNTER — Emergency Department (HOSPITAL_COMMUNITY)
Admission: EM | Admit: 2013-03-13 | Discharge: 2013-03-13 | Disposition: A | Discharge status: Home / Self Care | Payer: BC Managed Care – PPO | Source: Home / Self Care | Attending: Family Medicine | Admitting: Family Medicine

## 2013-03-13 ENCOUNTER — Encounter (HOSPITAL_COMMUNITY): Payer: Self-pay | Admitting: Emergency Medicine

## 2013-03-13 DIAGNOSIS — M79606 Pain in leg, unspecified: Secondary | ICD-10-CM

## 2013-03-13 DIAGNOSIS — M79609 Pain in unspecified limb: Secondary | ICD-10-CM

## 2013-03-13 MED ORDER — TRAMADOL HCL 50 MG PO TABS: 50.0000 mg | ORAL_TABLET | Freq: Four times a day (QID) | ORAL | Status: DC | PRN

## 2013-03-13 NOTE — ED Notes (Signed)
LM on [phone] (cardiovascular lab)... Pt has been adv to be there tomorrow at 0800

## 2013-03-13 NOTE — ED Notes (Signed)
Call back number verified in system .

## 2013-03-13 NOTE — ED Notes (Signed)
Pt c/o left leg pain onset x1 week... Pain starts at calf muscle and radiates upward Denies: inj/trauma, strenuous activity Alert w/no signs of acute distress.

## 2013-03-13 NOTE — Discharge Instructions (Signed)
Deep Vein Thrombosis  A deep vein thrombosis (DVT) is a blood clot that develops in the deep, larger veins of the leg, arm, or pelvis. These are more dangerous than clots that might form in veins near the surface of the body. A DVT can lead to complications if the clot breaks off and travels in the bloodstream to the lungs.   A DVT can damage the valves in your leg veins, so that instead of flowing upward, the blood pools in the lower leg. This is called post-thrombotic syndrome, and it can result in pain, swelling, discoloration, and sores on the leg.  CAUSES  Usually, several things contribute to blood clots forming. Contributing factors include:   The flow of blood slows down.   The inside of the vein is damaged in some way.   You have a condition that makes blood clot more easily.  RISK FACTORS  Some people are more likely than others to develop blood clots. Risk factors include:    Older age, especially over 75 years of age.   Having a family history of blood clots or if you have already had a blot clot.   Having major or lengthy surgery. This is especially true for surgery on the hip, knee, or belly (abdomen). Hip surgery is particularly high risk.   Breaking a hip or leg.   Sitting or lying still for a long time. This includes long-distance travel, paralysis, or recovery from an illness or surgery.   Having cancer or cancer treatment.   Having a long, thin tube (catheter) placed inside a vein during a medical procedure.   Being overweight (obese).   Pregnancy and childbirth.   Hormone changes make the blood clot more easily during pregnancy.   The fetus puts pressure on the veins of the pelvis.   There is a risk of injury to veins during delivery or a caesarean. The risk is highest just after childbirth.   Medicines with the female hormone estrogen. This includes birth control pills and hormone replacement therapy.   Smoking.   Other circulation or heart problems.    SIGNS AND SYMPTOMS  When  a clot forms, it can either partially or totally block the blood flow in that vein. Symptoms of a DVT can include:   Swelling of the leg or arm, especially if one side is much worse.   Warmth and redness of the leg or arm, especially if one side is much worse.   Pain in an arm or leg. If the clot is in the leg, symptoms may be more noticeable or worse when standing or walking.  The symptoms of a DVT that has traveled to the lungs (pulmonary embolism, PE) usually start suddenly and include:   Shortness of breath.   Coughing.   Coughing up blood or blood-tinged phlegm.   Chest pain. The chest pain is often worse with deep breaths.   Rapid heartbeat.  Anyone with these symptoms should get emergency medical treatment right away. Call your local emergency services (911 in the [place] if you have these symptoms.  DIAGNOSIS  If a DVT is suspected, your health care provider will take a full medical history and perform a physical exam. Tests that also may be required include:   Blood tests, including studies of the clotting properties of the blood.   Ultrasonography to see if you have clots in your legs or lungs.   X-rays to show the flow of blood when dye is injected into the veins (  venography).   Studies of your lungs if you have any chest symptoms.  PREVENTION   Exercise the legs regularly. Take a brisk 30-minute walk every day.   Maintain a weight that is appropriate for your height.   Avoid sitting or lying in bed for long periods of time without moving your legs.   Women, particularly those over the age of 35 years, should consider the risks and benefits of taking estrogen medicines, including birth control pills.   Do not smoke, especially if you take estrogen medicines.   Long-distance travel can increase your risk of DVT. You should exercise your legs by walking or pumping the muscles every hour.   In-hospital prevention:   Many of the risk factors above relate to situations that exist with  hospitalization, either for illness, injury, or elective surgery.   Your health care provider will assess you for the need for venous thromboembolism prophylaxis when you are admitted to the hospital. If you are having surgery, your surgeon will assess you the day of or day after surgery.   Prevention may include medical and nonmedical measures.  TREATMENT  Once identified, a DVT can be treated. It can also be prevented in some circumstances. Once you have had a DVT, you may be at increased risk for a DVT in the future. The most common treatment for DVT is blood thinning (anticoagulant) medicine, which reduces the blood's tendency to clot. Anticoagulants can stop new blood clots from forming and stop old ones from growing. They cannot dissolve existing clots. Your body does this by itself over time. Anticoagulants can be given by mouth, by IV access, or by injection. Your health care provider will determine the best program for you. Other medicines or treatments that may be used are:   Heparin or related medicines (low molecular weight heparin) are usually the first treatment for a blood clot. They act quickly. However, they cannot be taken orally.   Heparin can cause a fall in a component of blood that stops bleeding and forms blood clots (platelets). You will be monitored with blood tests to be sure this does not occur.   Warfarin is an anticoagulant that can be swallowed. It takes a few days to start working, so usually heparin or related medicines are used in combination. Once warfarin is working, heparin is usually stopped.   Less commonly, clot dissolving drugs (thrombolytics) are used to dissolve a DVT. They carry a high risk of bleeding, so they are used mainly in severe cases, where your life or a limb is threatened.   Very rarely, a blood clot in the leg needs to be removed surgically.   If you are unable to take anticoagulants, your health care provider may arrange for you to have a filter placed  in a main vein in your abdomen. This filter prevents clots from traveling to your lungs.  HOME CARE INSTRUCTIONS   Take all medicines prescribed by your health care provider. Only take over-the-counter or prescription medicines for pain, fever, or discomfort as directed by your health care provider.   Warfarin. Most people will continue taking warfarin after hospital discharge. Your health care provider will advise you on the length of treatment (usually 3 6 months, sometimes lifelong).   Too much and too little warfarin are both dangerous. Too much warfarin increases the risk of bleeding. Too little warfarin continues to allow the risk for blood clots. While taking warfarin, you will need to have regular blood tests to measure your   blood clotting time. These blood tests usually include both the prothrombin time (PT) and international normalized ratio (INR) tests. The PT and INR results allow your health care provider to adjust your dose of warfarin. The dose can change for many reasons. It is critically important that you take warfarin exactly as prescribed, and that you have your PT and INR levels drawn exactly as directed.   Many foods, especially foods high in vitamin K, can interfere with warfarin and affect the PT and INR results. Foods high in vitamin K include spinach, kale, broccoli, cabbage, collard and turnip greens, brussel sprouts, peas, cauliflower, seaweed, and parsley as well as beef and pork liver, green tea, and soybean oil. You should eat a consistent amount of foods high in vitamin K. Avoid major changes in your diet, or notify your health care provider before changing your diet. Arrange a visit with a dietitian to answer your questions.   Many medicines can interfere with warfarin and affect the PT and INR results. You must tell your health care provider about any and all medicines you take. This includes all vitamins and supplements. Be especially cautious with aspirin and  anti-inflammatory medicines. Ask your health care provider before taking these. Do not take or discontinue any prescribed or over-the-counter medicine except on the advice of your health care provider or pharmacist.   Warfarin can have side effects, primarily excessive bruising or bleeding. You will need to hold pressure over cuts for longer than usual. Your health care provider or pharmacist will discuss other potential side effects.   Alcohol can change the body's ability to handle warfarin. It is best to avoid alcoholic drinks or consume only very small amounts while taking warfarin. Notify your health care provider if you change your alcohol intake.   Notify your dentist or other health care providers before procedures.   Activity. Ask your health care provider how soon you can go back to normal activities. It is important to stay active to prevent blood clots. If you are on anticoagulant medicine, avoid contact sports.   Exercise. It is very important to exercise. This is especially important while traveling, sitting, or standing for long periods of time. Exercise your legs by walking or by pumping the muscles frequently. Take frequent walks.   Compression stockings. These are tight elastic stockings that apply pressure to the lower legs. This pressure can help keep the blood in the legs from clotting. You may need to wear compression stockings at home to help prevent a DVT.   Do not smoke. If you smoke, quit. Ask your health care provider for help with quitting smoking.   Learn as much as you can about DVT. Knowing more about the condition should help you keep it from coming back.   Wear a medical alert bracelet or carry a medical alert card.  SEEK MEDICAL CARE IF:   You notice a rapid heartbeat.   You feel weaker or more tired than usual.   You feel faint.   You notice increased bruising.   You feel your symptoms are not getting better in the time expected.   You believe you are having side  effects of medicine.  SEEK IMMEDIATE MEDICAL CARE IF:   You have chest pain.   You have trouble breathing.   You have new or increased swelling or pain in one leg.   You cough up blood.   You notice blood in vomit, in a bowel movement, or in urine.  MAKE SURE   YOU:   Understand these instructions.   Will watch your condition.   Will get help right away if you are not doing well or get worse.  Document Released: 02/13/2005 Document Revised: 12/04/2012 Document Reviewed: 10/21/2012  [employer] Patient Information 2014 [employer].

## 2013-03-13 NOTE — ED Provider Notes (Signed)
Medical screening examination/treatment/procedure(s) were performed by resident physician or non-physician practitioner and as supervising physician I was immediately available for consultation/collaboration.   Pauline Good MD.   Billy Fischer, MD 03/13/13 [phone]

## 2013-03-13 NOTE — ED Provider Notes (Signed)
CSN: [id number]     Arrival date & time 03/13/13  1759 History   First MD Initiated Contact with Patient 03/13/13 1912     Chief Complaint  Patient presents with  . Leg Pain   (Consider location/radiation/quality/duration/timing/severity/associated sxs/prior Treatment) HPI Comments: A 53-year-old female presents complaining of one week of left leg pain. She has pain in her left calf that radiates up toward her thigh. It seems to be worse when he tried to push off of that leg when she is walking. The pain has become increasingly severe to the point that it made her miss work today. She denies any injury and never has had anything like this before. Denies leg swelling, history of DVT or PE, recent travel. She does not take any hormonal medications.  Patient is a 53 y.o. female presenting with leg pain.  Leg Pain Associated symptoms: no fever     Past Medical History  Diagnosis Date  . Hypertension   . OSA (obstructive sleep apnea) 2006    Dx 7/06 with RDI 145.6 and desat to 62%. CPAP at 11 reduced RDI to 7.1. Repeat testing Aug 2006 showed RDI 0.7 on CPAP of 0.7  . Obesity   . Osteoarthritis     Of knees B. Uses hydrocodone sparingly.   . Allergic rhinitis   . Osteoarthritis    Past Surgical History  Procedure Laterality Date  . Tubal ligation     Family History  Problem Relation Age of Onset  . Heart murmur Sister   . CVA Mother    History  Substance Use Topics  . Smoking status: Never Smoker   . Smokeless tobacco: Never Used  . Alcohol Use: Yes   OB History   Grav Para Term Preterm Abortions TAB SAB Ect Mult Living   2 2 2       2      Review of Systems  Constitutional: Negative for fever and chills.  Eyes: Negative for visual disturbance.  Respiratory: Negative for cough and shortness of breath.   Cardiovascular: Negative for chest pain, palpitations and leg swelling.  Gastrointestinal: Negative for nausea, vomiting and abdominal pain.  Endocrine: Negative for  polydipsia and polyuria.  Genitourinary: Negative for dysuria, urgency and frequency.  Musculoskeletal: Positive for myalgias. Negative for arthralgias.  Skin: Negative for rash.  Neurological: Negative for dizziness, weakness and light-headedness.    Allergies  Review of patient's allergies indicates no known allergies.  Home Medications   Current Outpatient Rx  Name  Route  Sig  Dispense  Refill  . hydrochlorothiazide (HYDRODIURIL) 25 MG tablet   Oral   Take 1 tablet (25 mg total) by mouth daily.   30 tablet   6   . metoprolol succinate (TOPROL XL) 50 MG 24 hr tablet   Oral   Take 1 tablet (50 mg total) by mouth daily. Take with or immediately following a meal.   30 tablet   11   . methylPREDNISolone (MEDROL DOSEPAK) 4 MG tablet      follow package directions, start on Sunday, take until finished.   21 tablet   0   . naproxen sodium (ANAPROX) 220 MG tablet   Oral   Take 440 mg by mouth 2 (two) times daily with a meal.         . oxyCODONE-acetaminophen (PERCOCET/ROXICET) 5-325 MG per tablet   Oral   Take 1 tablet by mouth every 4 (four) hours as needed for severe pain.   20 tablet  0   . promethazine (PHENERGAN) 25 MG tablet   Oral   Take 1 tablet (25 mg total) by mouth every 6 (six) hours as needed for nausea or vomiting.   30 tablet   1   . traMADol (ULTRAM) 50 MG tablet   Oral   Take 1 tablet (50 mg total) by mouth every 6 (six) hours as needed.   15 tablet   0    BP 123/85  Pulse 86  Temp(Src) 97.8 F (36.6 C) (Oral)  Resp 16  SpO2 99% Physical Exam  Nursing note and vitals reviewed. Constitutional: She is oriented to person, place, and time. Vital signs are normal. She appears well-developed and well-nourished. No distress.  HENT:  Head: Normocephalic and atraumatic.  Pulmonary/Chest: Effort normal. No respiratory distress.  Musculoskeletal:       Left lower leg: She exhibits tenderness (palpable cord in the left posterior superior calf,  extending through the back of the knee and into the posterior thigh).       Legs: Neurological: She is alert and oriented to person, place, and time. She has normal strength. Coordination normal.  Skin: Skin is warm and dry. No rash noted. She is not diaphoretic.  Psychiatric: She has a normal mood and affect. Judgment normal.    ED Course  Procedures (including critical care time) Labs Review Labs Reviewed - No data to display Imaging Review No results found.  EKG Interpretation    Date/Time:    Ventricular Rate:    PR Interval:    QRS Duration:   QT Interval:    QTC Calculation:   R Axis:     Text Interpretation:              MDM   1. Leg pain    This is either a leg muscle cramp or a DVT. There is a tender palpable cord but no leg swelling. We are getting an venous duplex ultrasound in the morning. ED if any chest pain or shortness of breath. Tramadol as needed for pain in the leg.   Meds ordered this encounter  Medications  . traMADol (ULTRAM) 50 MG tablet    Sig: Take 1 tablet (50 mg total) by mouth every 6 (six) hours as needed.    Dispense:  15 tablet    Refill:  0    Order Specific Question:  Supervising Provider    Answer:  Ihor Gully D [place], PA-C 03/13/13 [phone]

## 2013-03-14 ENCOUNTER — Ambulatory Visit (HOSPITAL_COMMUNITY)
Admission: RE | Admit: 2013-03-14 | Discharge: 2013-03-14 | Disposition: A | Discharge status: Home / Self Care | Payer: BC Managed Care – PPO | Source: Ambulatory Visit | Attending: Family Medicine | Admitting: Family Medicine

## 2013-03-14 DIAGNOSIS — E669 Obesity, unspecified: Secondary | ICD-10-CM | POA: Insufficient documentation

## 2013-03-14 DIAGNOSIS — R079 Chest pain, unspecified: Secondary | ICD-10-CM | POA: Insufficient documentation

## 2013-03-14 DIAGNOSIS — M1990 Unspecified osteoarthritis, unspecified site: Secondary | ICD-10-CM

## 2013-03-14 DIAGNOSIS — M79609 Pain in unspecified limb: Secondary | ICD-10-CM | POA: Insufficient documentation

## 2013-03-14 DIAGNOSIS — R0789 Other chest pain: Secondary | ICD-10-CM

## 2013-03-14 NOTE — Progress Notes (Signed)
Left lower extremity venous duplex completed.  Left:  No evidence of DVT, superficial thrombosis, or Baker's cyst.  Right:  Negative for DVT in the common femoral vein.  

## 2013-12-15 ENCOUNTER — Telehealth: Payer: Self-pay | Admitting: *Deleted

## 2013-12-15 NOTE — Telephone Encounter (Signed)
Pharmacy called requesting a refill on patients HCTZ. It was last filled by Monna Fam.

## 2013-12-16 NOTE — Telephone Encounter (Signed)
Per Monna Fam, she is not managing patients bp. The refills were a one time fill. Patient will need to get them from her pcp.

## 2013-12-29 ENCOUNTER — Encounter (HOSPITAL_COMMUNITY): Payer: Self-pay | Admitting: Emergency Medicine

## 2014-01-05 ENCOUNTER — Telehealth: Payer: Self-pay | Admitting: General Practice

## 2014-01-05 NOTE — Telephone Encounter (Signed)
[facility] called stating the patient is requesting a refill on her HCTZ. Called patient and discussed with her that we are not managing her high blood pressure and she needs to see her regular doctor for that. Patient verbalized understanding and states she doesn't have one and that she was supposed to get in with Family Medicine but never did. Provided patient with [facility] phone number and told her to contact them there for a new patient appt. Patient verbalized understanding and had no other questions

## 2014-05-08 IMAGING — US US PELVIS COMPLETE
1 series · 14 of 25 positions shown · non-contrast
Comparison: None

CLINICAL DATA: Dysfunctional uterine bleeding for 3 months.

EXAM:
TRANSABDOMINAL AND TRANSVAGINAL ULTRASOUND OF PELVIS
TECHNIQUE: Both transabdominal and transvaginal ultrasound examinations of the
pelvis were performed. Transabdominal technique was performed for
global imaging of the pelvis including uterus, ovaries, adnexal
regions, and pelvic cul-de-sac. It was necessary to proceed with
endovaginal exam following the transabdominal exam to visualize the
uterus, endometrium and ovaries..

[Series 1: us pelvis complete · 14 of 77 slices shown]
[im 1/77]
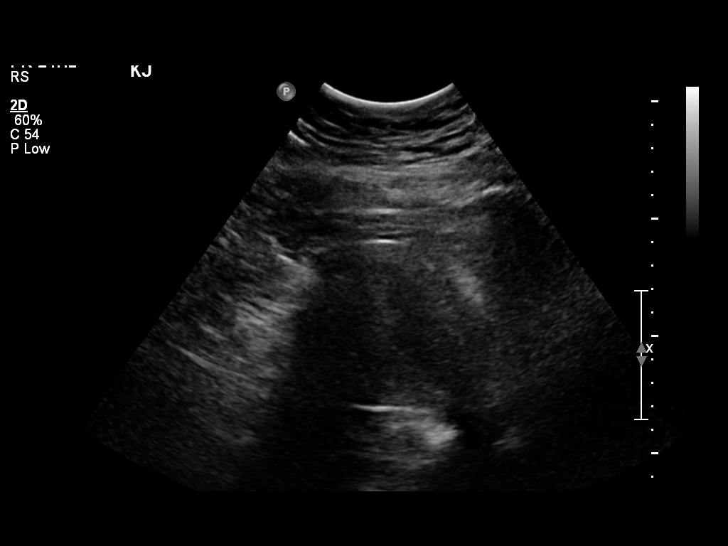
[im 7/77]
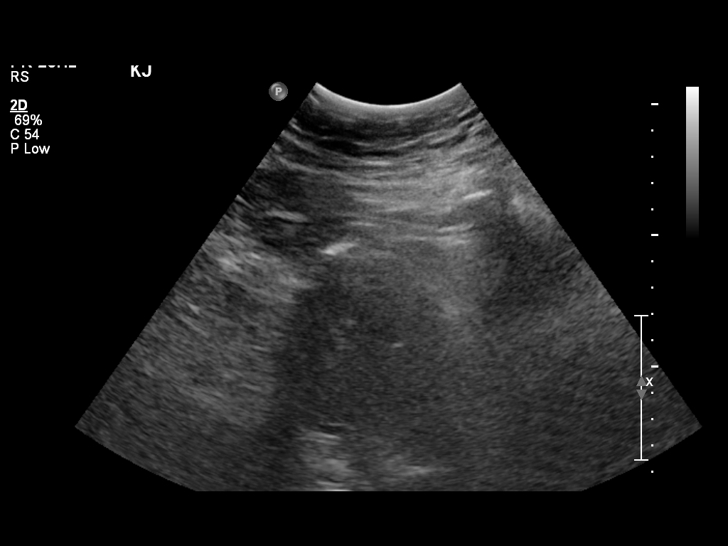
[im 13/77]
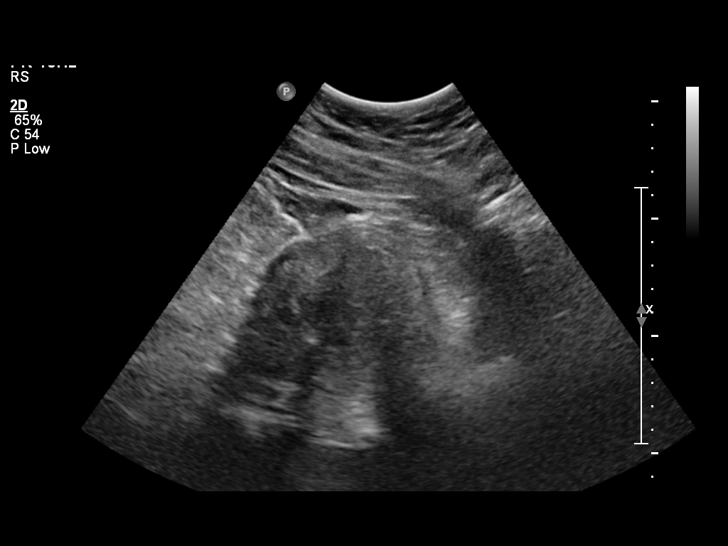
[im 20/77]
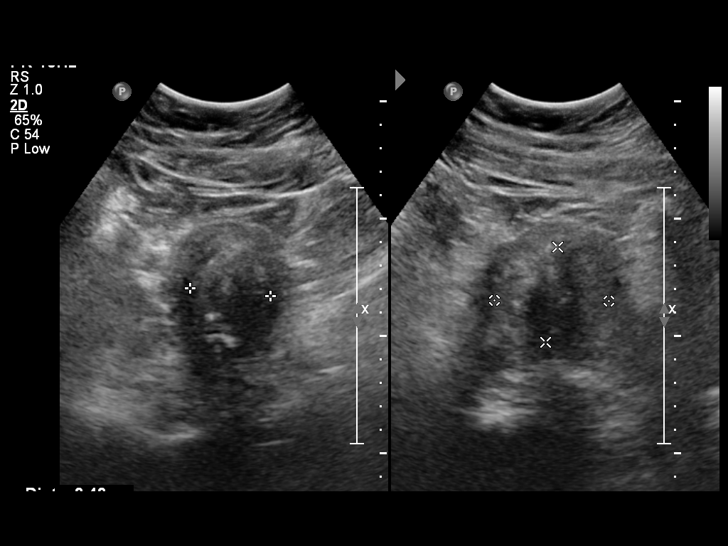
[im 26/77]
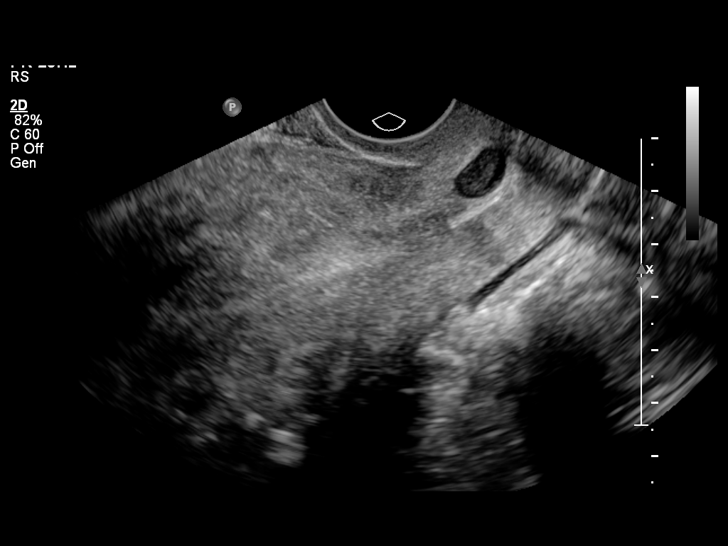
[im 29/77]
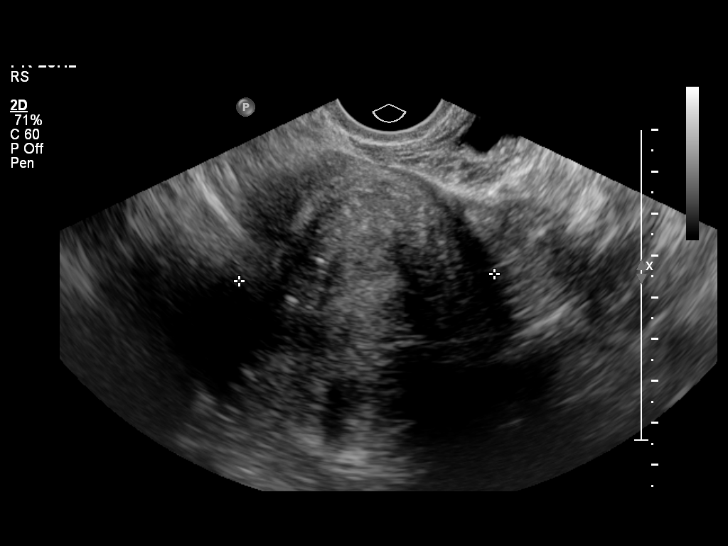
[im 35/77]
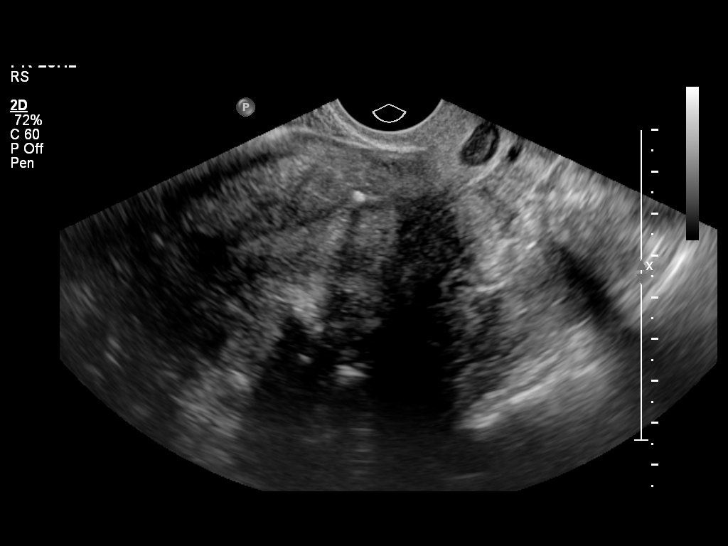
[im 42/77]
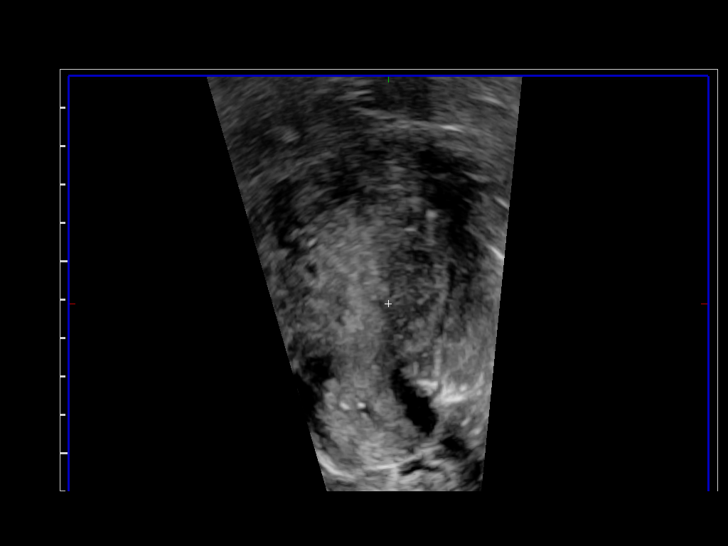
[im 48/77]
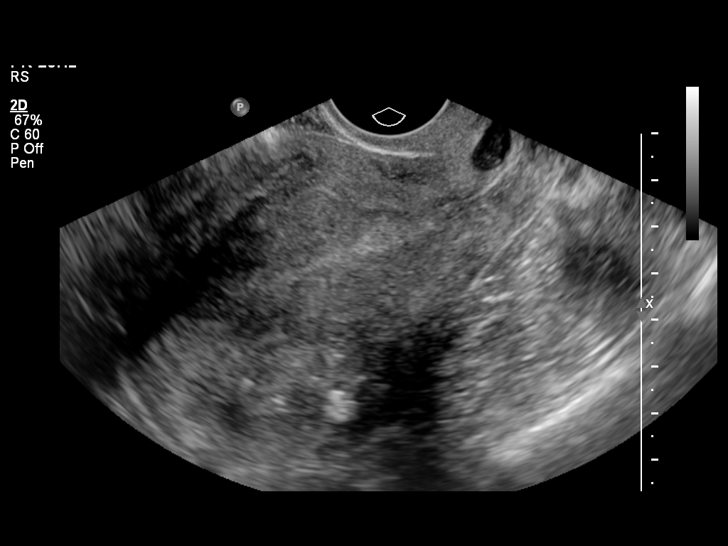
[im 51/77]
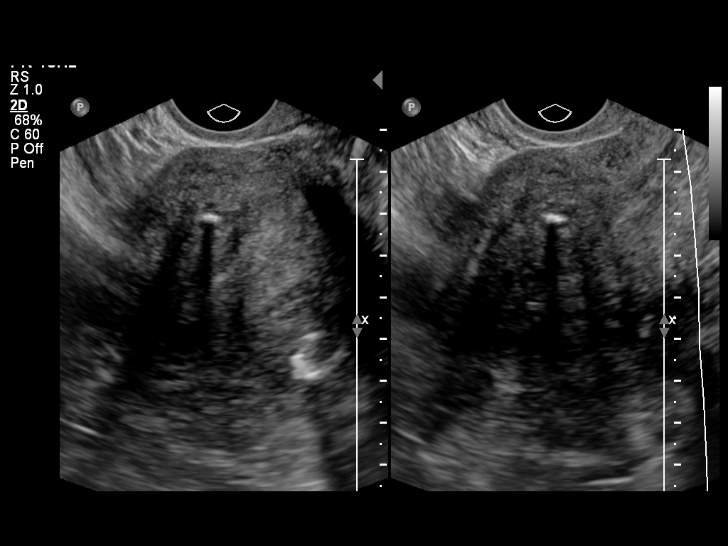
[im 58/77]
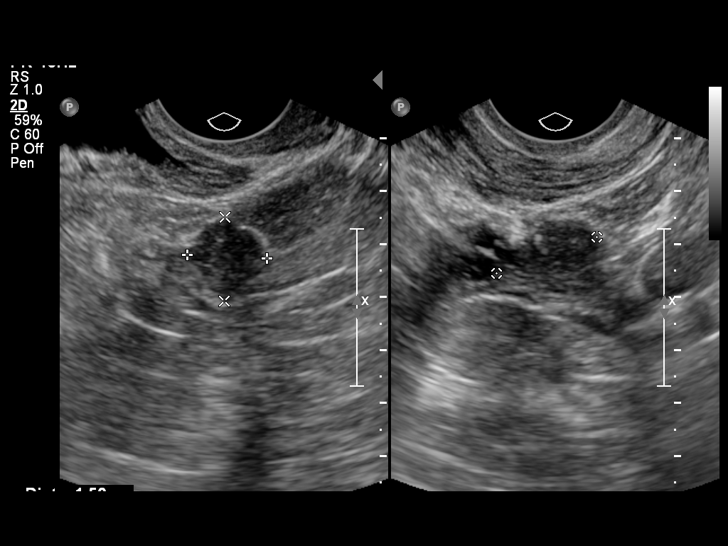
[im 64/77]
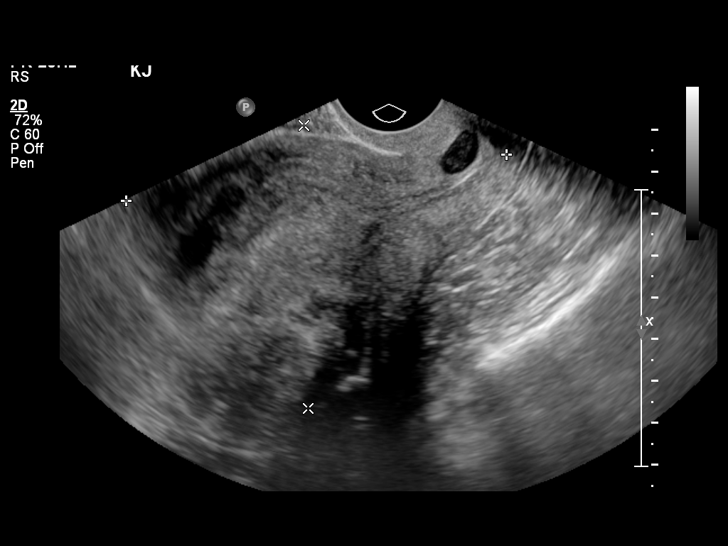
[im 70/77]
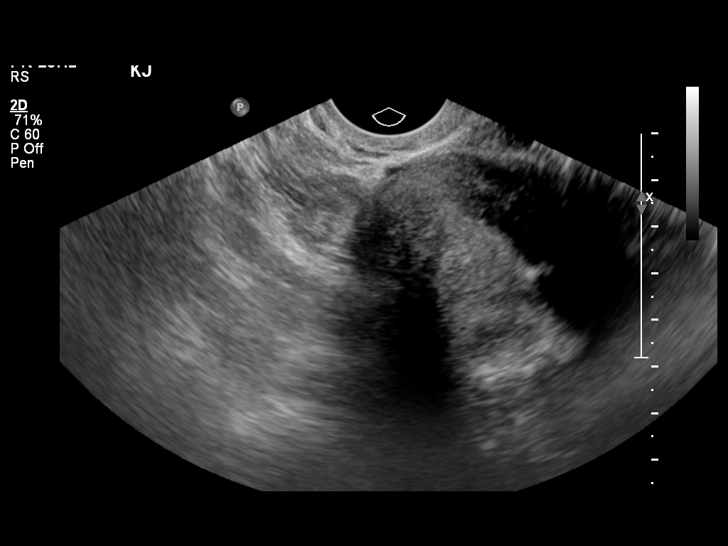
[im 77/77]
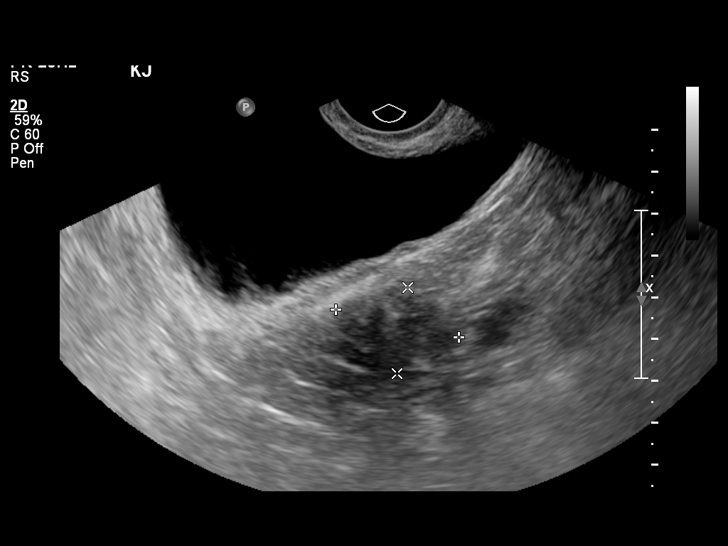

[14 of 25 positions shown; findings below may reference images not displayed]

FINDINGS: Uterus

Measurements: 9.9 x 6.9 x 6.1 cm.. Intramural fibroid in the
posterior myometrium measures 4.6 x 2.9 x 3.5 cm. Intramural fibroid
within the anterior myometrium measures 1.7 x 2.0 x 2.2 cm.

Endometrium

Thickness: 5.7 mm..  No focal abnormality visualized.

Right ovary

Measurements: 2.8 x 1.1 x 1.7 cm.. Normal appearance/no adnexal
mass.

Left ovary

Measurements: 1.5 x 1.6 x 2.0 cm.. Normal appearance/no adnexal
mass.

Other findings

No free fluid.
IMPRESSION: 1. Fibroid uterus.
2. Endometrium measures 5.7 mm. Which, in a premenopausal female, is
within normal limits.

## 2014-05-24 ENCOUNTER — Emergency Department (HOSPITAL_COMMUNITY)
Admission: EM | Admit: 2014-05-24 | Discharge: 2014-05-24 | Disposition: A | Discharge status: Home / Self Care | Payer: PRIVATE HEALTH INSURANCE | Attending: Emergency Medicine | Admitting: Emergency Medicine

## 2014-05-24 ENCOUNTER — Encounter (HOSPITAL_COMMUNITY): Payer: Self-pay | Admitting: Nurse Practitioner

## 2014-05-24 ENCOUNTER — Emergency Department (HOSPITAL_COMMUNITY)
Admission: EM | Admit: 2014-05-24 | Discharge: 2014-05-24 | Disposition: A | Discharge status: Home / Self Care | Payer: PRIVATE HEALTH INSURANCE | Source: Home / Self Care | Attending: Family Medicine | Admitting: Family Medicine

## 2014-05-24 ENCOUNTER — Encounter (HOSPITAL_COMMUNITY): Payer: Self-pay | Admitting: Emergency Medicine

## 2014-05-24 DIAGNOSIS — M7989 Other specified soft tissue disorders: Secondary | ICD-10-CM | POA: Diagnosis not present

## 2014-05-24 DIAGNOSIS — R600 Localized edema: Secondary | ICD-10-CM | POA: Diagnosis not present

## 2014-05-24 DIAGNOSIS — E669 Obesity, unspecified: Secondary | ICD-10-CM | POA: Insufficient documentation

## 2014-05-24 DIAGNOSIS — Z79899 Other long term (current) drug therapy: Secondary | ICD-10-CM | POA: Insufficient documentation

## 2014-05-24 DIAGNOSIS — Z8709 Personal history of other diseases of the respiratory system: Secondary | ICD-10-CM | POA: Insufficient documentation

## 2014-05-24 DIAGNOSIS — G4733 Obstructive sleep apnea (adult) (pediatric): Secondary | ICD-10-CM | POA: Diagnosis not present

## 2014-05-24 DIAGNOSIS — M1990 Unspecified osteoarthritis, unspecified site: Secondary | ICD-10-CM | POA: Diagnosis not present

## 2014-05-24 DIAGNOSIS — I10 Essential (primary) hypertension: Secondary | ICD-10-CM

## 2014-05-24 DIAGNOSIS — I471 Supraventricular tachycardia: Secondary | ICD-10-CM

## 2014-05-24 MED ORDER — HYDROCHLOROTHIAZIDE 25 MG PO TABS: 25.0000 mg | ORAL_TABLET | Freq: Every day | ORAL | Status: DC

## 2014-05-24 MED ORDER — METOPROLOL SUCCINATE ER 50 MG PO TB24: 50.0000 mg | ORAL_TABLET | Freq: Every day | ORAL | Status: DC

## 2014-05-24 NOTE — ED Provider Notes (Signed)
CSN: [id number]     Arrival date & time 05/24/14  1523 History   First MD Initiated Contact with Patient 05/24/14 1613     Chief Complaint  Patient presents with  . Leg Swelling  . Hypertension   (Consider location/radiation/quality/duration/timing/severity/associated sxs/prior Treatment) HPI          54 year old female presents complaining of left lower extremity swelling. This started 3 days ago. She has progressive swelling and minimal discomfort in the left leg below the knee. She has no history of leg swelling or edema. There is no redness. No recent travel. No history of DVT or PE. No numbness in the foot  Past Medical History  Diagnosis Date  . Hypertension   . OSA (obstructive sleep apnea) 2006    Dx 7/06 with RDI 145.6 and desat to 62%. CPAP at 9 reduced RDI to 7.1. Repeat testing Aug 2006 showed RDI 0.7 on CPAP of 0.7  . Obesity   . Osteoarthritis     Of knees B. Uses hydrocodone sparingly.   . Allergic rhinitis   . Osteoarthritis    Past Surgical History  Procedure Laterality Date  . Tubal ligation     Family History  Problem Relation Age of Onset  . Heart murmur Sister   . CVA Mother    History  Substance Use Topics  . Smoking status: Never Smoker   . Smokeless tobacco: Never Used  . Alcohol Use: Yes   OB History    Gravida Para Term Preterm AB TAB SAB Ectopic Multiple Living   2 2 2       2      Review of Systems  Cardiovascular: Positive for leg swelling.  All other systems reviewed and are negative.   Allergies  Review of patient's allergies indicates no known allergies.  Home Medications   Prior to Admission medications   Medication Sig Start Date End Date Taking? Authorizing Provider  hydrochlorothiazide (HYDRODIURIL) 25 MG tablet Take 1 tablet (25 mg total) by mouth daily. 01/09/13  Yes Olegario Messier, NP  metoprolol succinate (TOPROL XL) 50 MG 24 hr tablet Take 1 tablet (50 mg total) by mouth daily. Take with or immediately following a meal.  01/09/13  Yes Olegario Messier, NP  methylPREDNISolone (MEDROL DOSEPAK) 4 MG tablet follow package directions, start on Sunday, take until finished. 12/07/12   Billy Fischer, MD  naproxen sodium (ANAPROX) 220 MG tablet Take 440 mg by mouth 2 (two) times daily with a meal.    Historical Provider, MD  oxyCODONE-acetaminophen (PERCOCET/ROXICET) 5-325 MG per tablet Take 1 tablet by mouth every 4 (four) hours as needed for severe pain. 02/03/13   Olegario Messier, NP  promethazine (PHENERGAN) 25 MG tablet Take 1 tablet (25 mg total) by mouth every 6 (six) hours as needed for nausea or vomiting. 02/03/13   Olegario Messier, NP  traMADol (ULTRAM) 50 MG tablet Take 1 tablet (50 mg total) by mouth every 6 (six) hours as needed. 03/13/13   Freeman Caldron Kinta Martis, PA-C   BP 162/88 mmHg  Pulse 100  Temp(Src) 98.4 F (36.9 C) (Oral)  Resp 14  SpO2 98%  LMP 05/24/2014 Physical Exam  Constitutional: She is oriented to person, place, and time. Vital signs are normal. She appears well-developed and well-nourished. No distress.  HENT:  Head: Normocephalic and atraumatic.  Cardiovascular:  Pulses:      Dorsalis pedis pulses are 2+ on the right side, and 2+ on the left side.  There  is left lower extremity pitting edema 3+ up to the knee, no edema on the right  Pulmonary/Chest: Effort normal. No respiratory distress.  Neurological: She is alert and oriented to person, place, and time. She has normal strength. Coordination normal.  Skin: Skin is warm and dry. No rash noted. She is not diaphoretic.  Psychiatric: She has a normal mood and affect. Judgment normal.  Nursing note and vitals reviewed.   ED Course  Procedures (including critical care time) Labs Review Labs Reviewed - No data to display  Imaging Review No results found.   MDM   1. Edema of left lower extremity    Transferred to ED, needs [place] to r/o DVT       Liam Graham, PA-C 05/24/14 1636

## 2014-05-24 NOTE — ED Notes (Signed)
Pt sent from [facility], [place] for pain and swelling in L leg since Friday. [facility] wanted the pt to have a DVT study. She denies CP, SOB.

## 2014-05-24 NOTE — Discharge Instructions (Signed)
Edema °Edema is an abnormal buildup of fluids in your body tissues. Edema is somewhat dependent on gravity to pull the fluid to the lowest place in your body. That makes the condition more common in the legs and thighs (lower extremities). Painless swelling of the feet and ankles is common and becomes more likely as you get older. It is also common in looser tissues, like around your eyes.  °When the affected area is squeezed, the fluid may move out of that spot and leave a dent for a few moments. This dent is called pitting.  °CAUSES  °There are many possible causes of edema. Eating too much salt and being on your feet or sitting for a long time can cause edema in your legs and ankles. Hot weather may make edema worse. Common medical causes of edema include: °· Heart failure. °· Liver disease. °· Kidney disease. °· Weak blood vessels in your legs. °· Cancer. °· An injury. °· Pregnancy. °· Some medications. °· Obesity.  °SYMPTOMS  °Edema is usually painless. Your skin may look swollen or shiny.  °DIAGNOSIS  °Your health care provider may be able to diagnose edema by asking about your medical history and doing a physical exam. You may need to have tests such as X-rays, an electrocardiogram, or blood tests to check for medical conditions that may cause edema.  °TREATMENT  °Edema treatment depends on the cause. If you have heart, liver, or kidney disease, you need the treatment appropriate for these conditions. General treatment may include: °· Elevation of the affected body part above the level of your heart. °· Compression of the affected body part. Pressure from elastic bandages or support stockings squeezes the tissues and forces fluid back into the blood vessels. This keeps fluid from entering the tissues. °· Restriction of fluid and salt intake. °· Use of a water pill (diuretic). These medications are appropriate only for some types of edema. They pull fluid out of your body and make you urinate more often. This  gets rid of fluid and reduces swelling, but diuretics can have side effects. Only use diuretics as directed by your health care provider. °HOME CARE INSTRUCTIONS  °· Keep the affected body part above the level of your heart when you are lying down.   °· Do not sit still or stand for prolonged periods.   °· Do not put anything directly under your knees when lying down. °· Do not wear constricting clothing or garters on your upper legs.   °· Exercise your legs to work the fluid back into your blood vessels. This may help the swelling go down.   °· Wear elastic bandages or support stockings to reduce ankle swelling as directed by your health care provider.   °· Eat a low-salt diet to reduce fluid if your health care provider recommends it.   °· Only take medicines as directed by your health care provider.  °SEEK MEDICAL CARE IF:  °· Your edema is not responding to treatment. °· You have heart, liver, or kidney disease and notice symptoms of edema. °· You have edema in your legs that does not improve after elevating them.   °· You have sudden and unexplained weight gain. °SEEK IMMEDIATE MEDICAL CARE IF:  °· You develop shortness of breath or chest pain.   °· You cannot breathe when you lie down. °· You develop pain, redness, or warmth in the swollen areas.   °· You have heart, liver, or kidney disease and suddenly get edema. °· You have a fever and your symptoms suddenly get worse. °MAKE SURE YOU:  °·   Understand these instructions.  Will watch your condition.  Will get help right away if you are not doing well or get worse. Document Released: 02/13/2005 Document Revised: 06/30/2013 Document Reviewed: 12/06/2012 [facility] Patient Information 2015 [place], [place]. This information is not intended to replace advice given to you by your health care provider. Make sure you discuss any questions you have with your health care provider.   DASH Eating Plan DASH stands for "Dietary Approaches to Stop Hypertension." The  DASH eating plan is a healthy eating plan that has been shown to reduce high blood pressure (hypertension). Additional health benefits may include reducing the risk of type 2 diabetes mellitus, heart disease, and stroke. The DASH eating plan may also help with weight loss. WHAT DO I NEED TO KNOW ABOUT THE DASH EATING PLAN? For the DASH eating plan, you will follow these general guidelines:  Choose foods with a percent daily value for sodium of less than 5% (as listed on the food label).  Use salt-free seasonings or herbs instead of table salt or sea salt.  Check with your health care provider or pharmacist before using salt substitutes.  Eat lower-sodium products, often labeled as "lower sodium" or "no salt added."  Eat fresh foods.  Eat more vegetables, fruits, and low-fat dairy products.  Choose whole grains. Look for the word "whole" as the first word in the ingredient list.  Choose fish and skinless chicken or [place] more often than red meat. Limit fish, poultry, and meat to 6 oz (170 g) each day.  Limit sweets, desserts, sugars, and sugary drinks.  Choose heart-healthy fats.  Limit cheese to 1 oz (28 g) per day.  Eat more home-cooked food and less restaurant, buffet, and fast food.  Limit fried foods.  Cook foods using methods other than frying.  Limit canned vegetables. If you do use them, rinse them well to decrease the sodium.  When eating at a restaurant, ask that your food be prepared with less salt, or no salt if possible. WHAT FOODS CAN I EAT? Seek help from a dietitian for individual calorie needs. Grains Whole grain or whole wheat bread. Brown rice. Whole grain or whole wheat pasta. Quinoa, bulgur, and whole grain cereals. Low-sodium cereals. Corn or whole wheat flour tortillas. Whole grain cornbread. Whole grain crackers. Low-sodium crackers. Vegetables Fresh or frozen vegetables (raw, steamed, roasted, or grilled). Low-sodium or reduced-sodium tomato and  vegetable juices. Low-sodium or reduced-sodium tomato sauce and paste. Low-sodium or reduced-sodium canned vegetables.  Fruits All fresh, canned (in natural juice), or frozen fruits. Meat and Other Protein Products Ground beef (85% or leaner), grass-fed beef, or beef trimmed of fat. Skinless chicken or [place]. Ground chicken or [place]. Pork trimmed of fat. All fish and seafood. Eggs. Dried beans, peas, or lentils. Unsalted nuts and seeds. Unsalted canned beans. Dairy Low-fat dairy products, such as skim or 1% milk, 2% or reduced-fat cheeses, low-fat ricotta or cottage cheese, or plain low-fat yogurt. Low-sodium or reduced-sodium cheeses. Fats and Oils Tub margarines without trans fats. Light or reduced-fat mayonnaise and salad dressings (reduced sodium). Avocado. Safflower, olive, or canola oils. Natural peanut or almond butter. Other Unsalted popcorn and pretzels. The items listed above may not be a complete list of recommended foods or beverages. Contact your dietitian for more options. WHAT FOODS ARE NOT RECOMMENDED? Grains White bread. White pasta. White rice. Refined cornbread. Bagels and croissants. Crackers that contain trans fat. Vegetables Creamed or fried vegetables. Vegetables in a cheese sauce. Regular canned vegetables. Regular  canned tomato sauce and paste. Regular tomato and vegetable juices. Fruits Dried fruits. Canned fruit in light or heavy syrup. Fruit juice. Meat and Other Protein Products Fatty cuts of meat. Ribs, chicken wings, bacon, sausage, bologna, salami, chitterlings, fatback, hot dogs, bratwurst, and packaged luncheon meats. Salted nuts and seeds. Canned beans with salt. Dairy Whole or 2% milk, cream, half-and-half, and cream cheese. Whole-fat or sweetened yogurt. Full-fat cheeses or blue cheese. Nondairy creamers and whipped toppings. Processed cheese, cheese spreads, or cheese curds. Condiments Onion and garlic salt, seasoned salt, table salt, and sea salt.  Canned and packaged gravies. Worcestershire sauce. Tartar sauce. Barbecue sauce. Teriyaki sauce. Soy sauce, including reduced sodium. Steak sauce. Fish sauce. Oyster sauce. Cocktail sauce. Horseradish. Ketchup and mustard. Meat flavorings and tenderizers. Bouillon cubes. Hot sauce. Tabasco sauce. Marinades. Taco seasonings. Relishes. Fats and Oils Butter, stick margarine, lard, shortening, ghee, and bacon fat. Coconut, palm kernel, or palm oils. Regular salad dressings. Other Pickles and olives. Salted popcorn and pretzels. The items listed above may not be a complete list of foods and beverages to avoid. Contact your dietitian for more information. WHERE CAN I FIND MORE INFORMATION? National Heart, Lung, and Blood Institute: travelstabloid.com Document Released: 02/02/2011 Document Revised: 06/30/2013 Document Reviewed: 12/18/2012 [facility] Patient Information 2015 [place], [place]. This information is not intended to replace advice given to you by your health care provider. Make sure you discuss any questions you have with your health care provider.  Your workup today was negative for a Deep Vein Thrombosis. Here are some signs and symptoms to look out for. A deep vein thrombosis (DVT) is a blood clot that develops in the deep, larger veins of the leg, arm, or pelvis. These are more dangerous than clots that might form in veins near the surface of the body. A DVT can lead to serious and even life-threatening complications if the clot breaks off and travels in the bloodstream to the lungs.  A DVT can damage the valves in your leg veins so that instead of flowing upward, the blood pools in the lower leg. This is called post-thrombotic syndrome, and it can result in pain, swelling, discoloration, and sores on the leg. CAUSES Usually, several things contribute to the formation of blood clots. Contributing factors include:  The flow of blood slows down.  The inside of  the vein is damaged in some way.  You have a condition that makes blood clot more easily. RISK FACTORS Some people are more likely than others to develop blood clots. Risk factors include:   Smoking.  Being overweight (obese).  Sitting or lying still for a long time. This includes long-distance travel, paralysis, or recovery from an illness or surgery. Other factors that increase risk are:   Older age, especially over 28 years of age.  Having a family history of blood clots or if you have already had a blot clot.  Having major or lengthy surgery. This is especially true for surgery on the hip, knee, or belly (abdomen). Hip surgery is particularly high risk.  Having a long, thin tube (catheter) placed inside a vein during a medical procedure.  Breaking a hip or leg.  Having cancer or cancer treatment.  Pregnancy and childbirth.  Hormone changes make the blood clot more easily during pregnancy.  The fetus puts pressure on the veins of the pelvis.  There is a risk of injury to veins during delivery or a caesarean delivery. The risk is highest just after childbirth.  Medicines containing the  female hormone estrogen. This includes birth control pills and hormone replacement therapy.  Other circulation or heart problems.  SIGNS AND SYMPTOMS When a clot forms, it can either partially or totally block the blood flow in that vein. Symptoms of a DVT can include:  Swelling of the leg or arm, especially if one side is much worse.  Warmth and redness of the leg or arm, especially if one side is much worse.  Pain in an arm or leg. If the clot is in the leg, symptoms may be more noticeable or worse when standing or walking. The symptoms of a DVT that has traveled to the lungs (pulmonary embolism, PE) usually start suddenly and include:  Shortness of breath.  Coughing.  Coughing up blood or blood-tinged mucus.  Chest pain. The chest pain is often worse with deep breaths.  Rapid  heartbeat. Anyone with these symptoms should get emergency medical treatment right away. Do not wait to see if the symptoms will go away. Call your local emergency services (911 in the [place] if you have these symptoms. Do not drive yourself to the hospital. DIAGNOSIS If a DVT is suspected, your health care provider will take a full medical history and perform a physical exam. Tests that also may be required include:  Blood tests, including studies of the clotting properties of the blood.  Ultrasound to see if you have clots in your legs or lungs.  X-rays to show the flow of blood when dye is injected into the veins (venogram).  Studies of your lungs if you have any chest symptoms. PREVENTION  Exercise the legs regularly. Take a brisk 30-minute walk every day.  Maintain a weight that is appropriate for your height.  Avoid sitting or lying in bed for long periods of time without moving your legs.  Women, particularly those over the age of 9 years, should consider the risks and benefits of taking estrogen medicines, including birth control pills.  Do not smoke, especially if you take estrogen medicines.  Long-distance travel can increase your risk of DVT. You should exercise your legs by walking or pumping the muscles every hour.  Many of the risk factors above relate to situations that exist with hospitalization, either for illness, injury, or elective surgery. Prevention may include medical and nonmedical measures.  Your health care provider will assess you for the need for venous thromboembolism prevention when you are admitted to the hospital. If you are having surgery, your surgeon will assess you the day of or day after surgery. TREATMENT Once identified, a DVT can be treated. It can also be prevented in some circumstances. Once you have had a DVT, you may be at increased risk for a DVT in the future. The most common treatment for DVT is blood-thinning (anticoagulant) medicine,  which reduces the blood's tendency to clot. Anticoagulants can stop new blood clots from forming and stop old clots from growing. They cannot dissolve existing clots. Your body does this by itself over time. Anticoagulants can be given by mouth, through an IV tube, or by injection. Your health care provider will determine the best program for you. Other medicines or treatments that may be used are:  Heparin or related medicines (low molecular weight heparin) are often the first treatment for a blood clot. They act quickly. However, they cannot be taken orally and must be given either in shot form or by IV tube.  Heparin can cause a fall in a component of blood that stops bleeding and forms  blood clots (platelets). You will be monitored with blood tests to be sure this does not occur.  Warfarin is an anticoagulant that can be swallowed. It takes a few days to start working, so usually heparin or related medicines are used in combination. Once warfarin is working, heparin is usually stopped.  Factor Xa inhibitor medicines, such as rivaroxaban and apixaban, also reduce blood clotting. These medicines are taken orally and can often be used without heparin or related medicines.  Less commonly, clot dissolving drugs (thrombolytics) are used to dissolve a DVT. They carry a high risk of bleeding, so they are used mainly in severe cases where your life or a part of your body is threatened.  Very rarely, a blood clot in the leg needs to be removed surgically.  If you are unable to take anticoagulants, your health care provider may arrange for you to have a filter placed in a main vein in your abdomen. This filter prevents clots from traveling to your lungs. HOME CARE INSTRUCTIONS  Take all medicines as directed by your health care provider.  Learn as much as you can about DVT.  Wear a medical alert bracelet or carry a medical alert card.  Ask your health care provider how soon you can go back to normal  activities. It is important to stay active to prevent blood clots. If you are on anticoagulant medicine, avoid contact sports.  It is very important to exercise. This is especially important while traveling, sitting, or standing for long periods of time. Exercise your legs by walking or by tightening and relaxing your leg muscles regularly. Take frequent walks.  You may need to wear compression stockings. These are tight elastic stockings that apply pressure to the lower legs. This pressure can help keep the blood in the legs from clotting. Taking Warfarin Warfarin is a daily medicine that is taken by mouth. Your health care provider will advise you on the length of treatment (usually 3-6 months, sometimes lifelong). If you take warfarin:  Understand how to take warfarin and foods that can affect how warfarin works in Veterinary surgeon.  Too much and too little warfarin are both dangerous. Too much warfarin increases the risk of bleeding. Too little warfarin continues to allow the risk for blood clots. Warfarin and Regular Blood Testing While taking warfarin, you will need to have regular blood tests to measure your blood clotting time. These blood tests usually include both the prothrombin time (PT) and international normalized ratio (INR) tests. The PT and INR results allow your health care provider to adjust your dose of warfarin. It is very important that you have your PT and INR tested as often as directed by your health care provider.  Warfarin and Your Diet Avoid major changes in your diet, or notify your health care provider before changing your diet. Arrange a visit with a registered dietitian to answer your questions. Many foods, especially foods high in vitamin K, can interfere with warfarin and affect the PT and INR results. You should eat a consistent amount of foods high in vitamin K. Foods high in vitamin K include:   Spinach, kale, broccoli, cabbage, collard and turnip greens, Brussels  sprouts, peas, cauliflower, seaweed, and parsley.  Beef and pork liver.  Green tea.  Soybean oil. Warfarin with Other Medicines Many medicines can interfere with warfarin and affect the PT and INR results. You must:  Tell your health care provider about any and all medicines, vitamins, and supplements you take, including aspirin  and other over-the-counter anti-inflammatory medicines. Be especially cautious with aspirin and anti-inflammatory medicines. Ask your health care provider before taking these.  Do not take or discontinue any prescribed or over-the-counter medicine except on the advice of your health care provider or pharmacist. Warfarin Side Effects Warfarin can have side effects, such as easy bruising and difficulty stopping bleeding. Ask your health care provider or pharmacist about other side effects of warfarin. You will need to:  Hold pressure over cuts for longer than usual.  Notify your dentist and other health care providers that you are taking warfarin before you undergo any procedures where bleeding may occur. Warfarin with Alcohol and Tobacco   Drinking alcohol frequently can increase the effect of warfarin, leading to excess bleeding. It is best to avoid alcoholic drinks or to consume only very small amounts while taking warfarin. Notify your health care provider if you change your alcohol intake.   Do not use any tobacco products including cigarettes, chewing tobacco, or electronic cigarettes. If you smoke, quit. Ask your health care provider for help with quitting smoking. Alternative Medicines to Warfarin: Factor Xa Inhibitor Medicines  These blood-thinning medicines are taken by mouth, usually for several weeks or longer. It is important to take the medicine every single day at the same time each day.  There are no regular blood tests required when using these medicines.  There are fewer food and drug interactions than with warfarin.  The side effects of this  class of medicine are similar to those of warfarin, including excessive bruising or bleeding. Ask your health care provider or pharmacist about other potential side effects. SEEK MEDICAL CARE IF:  You notice a rapid heartbeat.  You feel weaker or more tired than usual.  You feel faint.  You notice increased bruising.  You feel your symptoms are not getting better in the time expected.  You believe you are having side effects of medicine. SEEK IMMEDIATE MEDICAL CARE IF:  You have chest pain.  You have trouble breathing.  You have new or increased swelling or pain in one leg.  You cough up blood.  You notice blood in vomit, in a bowel movement, or in urine. MAKE SURE YOU:  Understand these instructions.  Will watch your condition.  Will get help right away if you are not doing well or get worse. Document Released: 02/13/2005 Document Revised: 06/30/2013 Document Reviewed: 10/21/2012 [facility] Patient Information 2015 [place], [place]. This information is not intended to replace advice given to you by your health care provider. Make sure you discuss any questions you have with your health care provider.

## 2014-05-24 NOTE — Progress Notes (Signed)
VASCULAR LAB PRELIMINARY  PRELIMINARY  PRELIMINARY  PRELIMINARY  Left lower extremity venous Doppler completed.    Preliminary report:  There is no DVT or SVT noted in the left lower extremity.  Deacon Gadbois, RVT 05/24/2014, 6:38 PM

## 2014-05-24 NOTE — ED Provider Notes (Signed)
CSN: [id number]     Arrival date & time 05/24/14  1710 History   First MD Initiated Contact with Patient 05/24/14 1738     Chief Complaint  Patient presents with  . Leg Swelling     (Consider location/radiation/quality/duration/timing/severity/associated sxs/prior Treatment) HPI Emily Schmitt is a 54 year old female with past medical history of hypertension, obesity who presents the ER complaining of left leg swelling. Patient states she ran of her prescribed hydrochlorothiazide on Thursday, noticed a gradual increase of leg swelling beginning Friday morning. She noticed swelling in her left lower leg which has progressed gradually over the past 3 days. Patient denies any pain, tenderness, erythema, warmth. Patient denies numbness, tingling, weakness, loss of sensation or function. Patient denies any injury or recent skin infections in the area. Patient denies chest pain, shortness of breath, headache, blurred vision, dizziness, weakness, recent travel, fever, history of blood clots.   Past Medical History  Diagnosis Date  . Hypertension   . OSA (obstructive sleep apnea) 2006    Dx 7/06 with RDI 145.6 and desat to 62%. CPAP at 11 reduced RDI to 7.1. Repeat testing Aug 2006 showed RDI 0.7 on CPAP of 0.7  . Obesity   . Osteoarthritis     Of knees B. Uses hydrocodone sparingly.   . Allergic rhinitis   . Osteoarthritis    Past Surgical History  Procedure Laterality Date  . Tubal ligation     Family History  Problem Relation Age of Onset  . Heart murmur Sister   . CVA Mother    History  Substance Use Topics  . Smoking status: Never Smoker   . Smokeless tobacco: Never Used  . Alcohol Use: Yes   OB History    Gravida Para Term Preterm AB TAB SAB Ectopic Multiple Living   2 2 2       2      Review of Systems  Constitutional: Negative for fever.  HENT: Negative for trouble swallowing.   Eyes: Negative for visual disturbance.  Respiratory: Negative for shortness of breath.    Cardiovascular: Positive for leg swelling. Negative for chest pain.  Gastrointestinal: Negative for nausea, vomiting and abdominal pain.  Genitourinary: Negative for dysuria.  Musculoskeletal: Negative for neck pain.  Skin: Negative for rash.  Neurological: Negative for dizziness, weakness and numbness.  Psychiatric/Behavioral: Negative.       Allergies  Review of patient's allergies indicates no known allergies.  Home Medications   Prior to Admission medications   Medication Sig Start Date End Date Taking? Authorizing Provider  naproxen sodium (ANAPROX) 220 MG tablet Take 440 mg by mouth 2 (two) times daily as needed (headache).    Yes Historical Provider, MD  hydrochlorothiazide (HYDRODIURIL) 25 MG tablet Take 1 tablet (25 mg total) by mouth daily. 05/24/14   Dahlia Bailiff, PA-C  methylPREDNISolone (MEDROL DOSEPAK) 4 MG tablet follow package directions, start on Sunday, take until finished. Patient taking differently: Take 4 mg by mouth daily. follow package directions, start on Sunday, take until finished. 12/07/12   Billy Fischer, MD  metoprolol succinate (TOPROL XL) 50 MG 24 hr tablet Take 1 tablet (50 mg total) by mouth daily. Take with or immediately following a meal. 05/24/14   Dahlia Bailiff, PA-C  oxyCODONE-acetaminophen (PERCOCET/ROXICET) 5-325 MG per tablet Take 1 tablet by mouth every 4 (four) hours as needed for severe pain. 02/03/13   Olegario Messier, NP  promethazine (PHENERGAN) 25 MG tablet Take 1 tablet (25 mg total) by mouth every 6 (six)  hours as needed for nausea or vomiting. 02/03/13   Olegario Messier, NP  traMADol (ULTRAM) 50 MG tablet Take 1 tablet (50 mg total) by mouth every 6 (six) hours as needed. 03/13/13   Freeman Caldron Baker, PA-C   BP 147/74 mmHg  Pulse 81  Temp(Src) 98.4 F (36.9 C) (Oral)  Resp 16  SpO2 100%  LMP 05/24/2014 Physical Exam  Constitutional: She is oriented to person, place, and time. She appears well-developed and well-nourished. No distress.   HENT:  Head: Normocephalic and atraumatic.  Mouth/Throat: Oropharynx is clear and moist. No oropharyngeal exudate.  Eyes: Right eye exhibits no discharge. Left eye exhibits no discharge. No scleral icterus.  Neck: Normal range of motion.  Cardiovascular: Normal rate, regular rhythm and normal heart sounds.   No murmur heard. Pulmonary/Chest: Effort normal and breath sounds normal. No respiratory distress.  Abdominal: Soft. There is no tenderness.  Musculoskeletal: Normal range of motion. She exhibits no edema or tenderness.  Neurological: She is alert and oriented to person, place, and time. No cranial nerve deficit. Coordination normal.  Skin: Skin is warm and dry. No rash noted. She is not diaphoretic.  2+ pitting edema noted in left lower extremity extending from ankle to proximal portion of the tibial region. No erythema, warmth, skin sloughing or signs of infection or wounds noted. DP pulse 2+. Patient has 5 out of 5 motor strength at hip, knee, ankle.  Psychiatric: She has a normal mood and affect.  Nursing note and vitals reviewed.   ED Course  Procedures (including critical care time) Labs Review Labs Reviewed - No data to display  Imaging Review No results found.   EKG Interpretation None     Left lower extremity venous Doppler completed.   Preliminary report: There is no DVT or SVT noted in the left lower extremity.  KANADY, CANDACE, RVT 05/24/2014, 6:38 PM  MDM   Final diagnoses:  Leg swelling    Patient here is lower leg swelling. Patient was sent from urgent care for follow-up with DVT study. Ultrasound with Doppler here was negative for DVT. Patient does not show any signs or symptoms of cellulitis. On exam patient's leg is benign appearing. Likely patient is experiencing some leg swelling status post stopping her HCTZ diuretic. No concern for DVT. No concern for cellulitis. Patient neurovascularly intact. We'll discharge patient home. Patient  hemodynamically stable, and in no acute distress. Patient denying any pain. Strongly encouraged patient to follow up with her primary care doctor at the internal medicine clinic this week. Discussed return precautions with patient, patient verbalizes understanding and agreement of this plan. Patient requested a refill on her HCTZ and metoprolol. We will refill for one week. Encouraged patient to call or return to ER should she have any questions or concerns.  BP 147/74 mmHg  Pulse 81  Temp(Src) 98.4 F (36.9 C) (Oral)  Resp 16  SpO2 100%  LMP 05/24/2014  Signed,  Dahlia Bailiff, PA-C 8:11 PM  Patient discussed with Dr. Francine Graven, MD    Dahlia Bailiff, PA-C 05/24/14 [place], DO 05/27/14 1401

## 2014-05-24 NOTE — ED Notes (Signed)
Pt report left lower leg swelling since Friday. Mild pain that comes and goes.  No treatments tried.

## 2014-05-24 NOTE — ED Notes (Signed)
US at bedside

## 2014-06-24 ENCOUNTER — Telehealth: Payer: Self-pay | Admitting: Internal Medicine

## 2014-06-24 NOTE — Telephone Encounter (Signed)
Call to patient to confirm appointment for 06/25/14 at 8:15 phone is disconnected

## 2014-06-25 ENCOUNTER — Encounter: Payer: Self-pay | Admitting: Internal Medicine

## 2014-06-25 ENCOUNTER — Ambulatory Visit (INDEPENDENT_AMBULATORY_CARE_PROVIDER_SITE_OTHER): Payer: PRIVATE HEALTH INSURANCE | Admitting: Internal Medicine

## 2014-06-25 VITALS — BP 159/90 | HR 88 | Temp 98.1°F | Ht 71.5 in | Wt 309.2 lb

## 2014-06-25 DIAGNOSIS — Z9989 Dependence on other enabling machines and devices: Secondary | ICD-10-CM | POA: Diagnosis not present

## 2014-06-25 DIAGNOSIS — M170 Bilateral primary osteoarthritis of knee: Secondary | ICD-10-CM | POA: Diagnosis not present

## 2014-06-25 DIAGNOSIS — G4733 Obstructive sleep apnea (adult) (pediatric): Secondary | ICD-10-CM | POA: Diagnosis not present

## 2014-06-25 DIAGNOSIS — I471 Supraventricular tachycardia: Secondary | ICD-10-CM

## 2014-06-25 DIAGNOSIS — Z6841 Body Mass Index (BMI) 40.0 and over, adult: Secondary | ICD-10-CM

## 2014-06-25 DIAGNOSIS — I10 Essential (primary) hypertension: Secondary | ICD-10-CM

## 2014-06-25 MED ORDER — NAPROXEN SODIUM 220 MG PO TABS: 440.0000 mg | ORAL_TABLET | Freq: Two times a day (BID) | ORAL | Status: DC | PRN

## 2014-06-25 MED ORDER — HYDROCHLOROTHIAZIDE 25 MG PO TABS: 25.0000 mg | ORAL_TABLET | Freq: Every day | ORAL | Status: DC

## 2014-06-25 MED ORDER — METOPROLOL SUCCINATE ER 50 MG PO TB24: 50.0000 mg | ORAL_TABLET | Freq: Every day | ORAL | Status: DC

## 2014-06-25 NOTE — Assessment & Plan Note (Signed)
Clinical history and PE are consistent with OA. She has never had x ray of her knees but I don't feel that we need to do this at this time. Her pain is manageable on Naprosyn.  Plan  - discussed wt loss and how that will help with improvement in her arthritis. - will continue with Naprosyn  - routine follow and consider imaging if indicated.

## 2014-06-25 NOTE — Assessment & Plan Note (Signed)
Discussed wt loss with lifestyle modifications. I have made a referral to [facility]. She will make an appointment with her.

## 2014-06-25 NOTE — Progress Notes (Signed)
Patient ID: Emily Schmitt, female   DOB: 1960-07-15, 54 y.o.   MRN: [id number]   Subjective:   HPI: [name] is a 54 y.o. woman with past medical history below presents to reestablish care. She was last evaluated at our clinic many years ago and she's been without her PCP. She was seen in the emergency department on 05/24/2014 for a edema of her lower extremities. Doppler studies for DVT were negative. It was felt that her edema was because she had been off her diuretic therapy. Patient had been off her HCTZ and Metoprolol, which were restarted and recommended that she follows up with [place].  Today she is feeling well. However, she does have some pain in her knees bilaterally due to osteoarthritis. She takes naproxen as needed.  She is requesting for refill of her medications.   Past Medical History  Diagnosis Date  . Hypertension   . OSA (obstructive sleep apnea) 2006    Dx 7/06 with RDI 145.6 and desat to 62%. CPAP at 11 reduced RDI to 7.1. Repeat testing Aug 2006 showed RDI 0.7 on CPAP of 0.7  . Obesity   . Osteoarthritis     Of knees B. Uses hydrocodone sparingly.   . Allergic rhinitis   . Osteoarthritis     ROS: Constitutional:  Denies fevers, chills, diaphoresis, appetite change and fatigue.  Respiratory: Obstructive sleep apnea, uses a CPAP machine. Denies SOB, DOE, cough, chest tightness, and wheezing.  CVS: No chest pain, palpitations and leg swelling.  GI: No abdominal pain, nausea, vomiting, bloody stools GU: No dysuria, frequency, hematuria, or flank pain.  MSK: Bilateral knee pains. No myalgias, back pain, joint swelling, arthralgias  Psych: No depression symptoms. No SI or SA.    Objective:  Physical Exam: Filed Vitals:   06/25/14 0834  BP: 159/90  Pulse: 88  Temp: 98.1 F (36.7 C)  TempSrc: Oral  Height: 5' 11.5" (1.816 m)  Weight: 309 lb 3.2 oz (140.252 kg)  SpO2: 99%   General: Obese. No acute distress.  HEENT: Normal oral mucosa. MMM.  Lungs:  CTA bilaterally. No wheezing. Heart: RRR; no extra sounds or murmurs  Abdomen: Non-distended, normal bowel sounds, soft, nontender; no hepatosplenomegaly  Extremities: Mild bilateral knee tenderness. No evidence of fluid or inflammation. She does have a minimal edema in her left lower extremity. Neurologic: Normal EOM,  Alert and oriented x3. No obvious neurologic/cranial nerve deficits.  Assessment & Plan:  Discussed case with Dr   See problem based charting for assessment and plan.

## 2014-06-25 NOTE — Assessment & Plan Note (Signed)
She is asymptomatic without palpitations. No other cardiopulmonary symptoms. She takes Metoprolol 50 mg daily. She was last seen by Dr. Aundra Dubin of cardiology in 2013 who recommended that if she fails pharmacological treatment consideration for ablation should be pursued. She did not follow up with them since then.  Plan  - continue with Metoprolol 50 mg daily - Provided with the phone number for Dr Claris Gladden office to call and make an appointment.

## 2014-06-25 NOTE — Assessment & Plan Note (Addendum)
BP Readings from Last 3 Encounters:  06/25/14 159/90  05/24/14 148/85  05/24/14 162/88    Lab Results  Component Value Date   NA 137 01/10/2012   K 4.1 01/10/2012   CREATININE 0.68 01/10/2012    Assessment: Blood pressure control: moderately elevated Progress toward BP goal:  deteriorated Comments: she has been off her HCTZ for 3 weeks.   Plan: Medications:  HCTZ 25 mg daily. Also continue with metoprolol 50 mg daily prescibed for SVT Educational resources provided: brochure, handout, video Self management tools provided:   Other plans: Follow up in 1-2 months. Discussed weight loss strategies. She will be evaluated by [facility] Penny.

## 2014-06-25 NOTE — Patient Instructions (Addendum)
General Instructions: I will send you to [facility] to help you with weight loss advice I will refill your medications today  Please call and schedule appointment with your cardiologist office (Dr Aundra Dubin) [phone] Please come back in 1-2 months   Please bring your medicines with you each time you come to clinic.  Medicines may include prescription medications, over-the-counter medications, herbal remedies, eye drops, vitamins, or other pills.   Progress Toward Treatment Goals:  Treatment Goal 06/25/2014  Blood pressure deteriorated    Self Care Goals & Plans:  Self Care Goal 06/25/2014  Manage my medications take my medicines as prescribed; bring my medications to every visit; refill my medications on time; follow the sick day instructions if I am sick  Monitor my health keep track of my weight  Eat healthy foods eat more vegetables; eat fruit for snacks and desserts; eat baked foods instead of fried foods  Be physically active find an activity I enjoy    No flowsheet data found.   Care Management & Community Referrals:  Referral 06/25/2014  Referrals made for care management support none needed

## 2014-06-25 NOTE — Assessment & Plan Note (Signed)
She states that she is compliant with her CPAP machine. Encouraged her to continue.

## 2014-06-26 NOTE — Progress Notes (Signed)
Internal Medicine Clinic Attending  Case discussed with Dr. Kazibwe soon after the resident saw the patient.  We reviewed the resident's history and exam and pertinent patient test results.  I agree with the assessment, diagnosis, and plan of care documented in the resident's note. 

## 2014-07-23 ENCOUNTER — Telehealth: Payer: Self-pay | Admitting: Internal Medicine

## 2014-07-23 NOTE — Telephone Encounter (Signed)
Call to patient to confirm appointment for 07/24/14 at 9:15 & 10:30 phone is disconnected

## 2014-07-24 ENCOUNTER — Encounter: Payer: Self-pay | Admitting: Internal Medicine

## 2014-07-24 ENCOUNTER — Ambulatory Visit (INDEPENDENT_AMBULATORY_CARE_PROVIDER_SITE_OTHER): Payer: PRIVATE HEALTH INSURANCE | Admitting: Dietician

## 2014-07-24 ENCOUNTER — Encounter: Payer: Self-pay | Admitting: Dietician

## 2014-07-24 ENCOUNTER — Ambulatory Visit (INDEPENDENT_AMBULATORY_CARE_PROVIDER_SITE_OTHER): Payer: PRIVATE HEALTH INSURANCE | Admitting: Internal Medicine

## 2014-07-24 VITALS — BP 126/57 | HR 81 | Temp 98.2°F | Ht 71.5 in | Wt 301.0 lb

## 2014-07-24 DIAGNOSIS — Z713 Dietary counseling and surveillance: Secondary | ICD-10-CM | POA: Diagnosis not present

## 2014-07-24 DIAGNOSIS — Z760 Encounter for issue of repeat prescription: Secondary | ICD-10-CM

## 2014-07-24 DIAGNOSIS — N920 Excessive and frequent menstruation with regular cycle: Secondary | ICD-10-CM

## 2014-07-24 DIAGNOSIS — I10 Essential (primary) hypertension: Secondary | ICD-10-CM

## 2014-07-24 DIAGNOSIS — Z6841 Body Mass Index (BMI) 40.0 and over, adult: Secondary | ICD-10-CM

## 2014-07-24 LAB — CBC
HEMATOCRIT: 30.5 % — AB (ref 36.0–46.0)
HEMOGLOBIN: 9 g/dL — AB (ref 12.0–15.0)
MCH: 21 pg — AB (ref 26.0–34.0)
MCHC: 29.5 g/dL — ABNORMAL LOW (ref 30.0–36.0)
MCV: 71.3 fL — AB (ref 78.0–100.0)
MPV: 8.8 fL (ref 8.6–12.4)
Platelets: 312 10*3/uL (ref 150–400)
RBC: 4.28 MIL/uL (ref 3.87–5.11)
RDW: 20.6 % — AB (ref 11.5–15.5)
WBC: 3.4 10*3/uL — AB (ref 4.0–10.5)

## 2014-07-24 LAB — BASIC METABOLIC PANEL
BUN: 14 mg/dL (ref 6–23)
CO2: 27 mEq/L (ref 19–32)
Calcium: 9.2 mg/dL (ref 8.4–10.5)
Chloride: 100 mEq/L (ref 96–112)
Creat: 0.7 mg/dL (ref 0.50–1.10)
Glucose, Bld: 160 mg/dL — ABNORMAL HIGH (ref 70–99)
POTASSIUM: 3.4 meq/L — AB (ref 3.5–5.3)
SODIUM: 134 meq/L — AB (ref 135–145)

## 2014-07-24 MED ORDER — NAPROXEN SODIUM 220 MG PO TABS: 440.0000 mg | ORAL_TABLET | Freq: Two times a day (BID) | ORAL | Status: DC | PRN

## 2014-07-24 MED ORDER — METOPROLOL SUCCINATE ER 50 MG PO TB24: 50.0000 mg | ORAL_TABLET | Freq: Every day | ORAL | Status: DC

## 2014-07-24 MED ORDER — HYDROCHLOROTHIAZIDE 25 MG PO TABS: 25.0000 mg | ORAL_TABLET | Freq: Every day | ORAL | Status: DC

## 2014-07-24 MED ORDER — FERROUS SULFATE 325 (65 FE) MG PO TABS: 325.0000 mg | ORAL_TABLET | Freq: Every day | ORAL | Status: DC

## 2014-07-24 NOTE — Assessment & Plan Note (Addendum)
BP Readings from Last 3 Encounters:  07/24/14 126/57  06/25/14 159/90  05/24/14 148/85    Lab Results  Component Value Date   NA 137 01/10/2012   K 4.1 01/10/2012   CREATININE 0.68 01/10/2012   Wt Readings from Last 3 Encounters:  07/24/14 301 lb (136.533 kg)  06/25/14 309 lb 3.2 oz (140.252 kg)  01/09/13 318 lb 11.2 oz (144.561 kg)    Assessment: Blood pressure control:   Progress toward BP goal:    Comments: pt with excellent control at this time along with good weight loss  Plan: Medications:  continue current medications of HCTZ 25 g daily and metoprolol succinate 59 g daily Educational resources provided: brochure (denies) Self management tools provided:   Other plans: Labs not done since 2013 therefore bmet drawn today, f/u in 3 months

## 2014-07-24 NOTE — Patient Instructions (Signed)
General Instructions:   Please bring your medicines with you each time you come to clinic.  Medicines may include prescription medications, over-the-counter medications, herbal remedies, eye drops, vitamins, or other pills.   For your Blood pressure keep up the good work! We will follow up in 3 months .    Progress Toward Treatment Goals:  Treatment Goal 06/25/2014  Blood pressure deteriorated    Self Care Goals & Plans:  Self Care Goal 07/24/2014  Manage my medications take my medicines as prescribed; bring my medications to every visit; refill my medications on time  Monitor my health -  Eat healthy foods drink diet soda or water instead of juice or soda; eat more vegetables; eat foods that are low in salt; eat baked foods instead of fried foods; eat fruit for snacks and desserts  Be physically active -    No flowsheet data found.   Care Management & Community Referrals:  Referral 06/25/2014  Referrals made for care management support none needed

## 2014-07-24 NOTE — Assessment & Plan Note (Addendum)
Pt has a hx of uterine fibroids as diagnosed by her OB/GYN. She has not been on any hormonal therapy. She has also not taking any iron has been feeling fatigued. The last CBC on record here was from 2014. Patient is interested in taking iron supplementation as she was on them previously. The patient has not had complete cessation of her periods ever before.  -CBC -Ferrous sulfate 325 daily -recommend f/u with OB/GYN for treatment if continues to be significant

## 2014-07-24 NOTE — Progress Notes (Signed)
   Medical Nutrition Therapy:  Appt start time: 1030 end time:  1130.  Assessment:  Primary concerns today: weight loss Patient wants to decrease her weight to get off her blood pressure medicine and to help her knee pain. She realizes her weight is contributing to many of her health issues. She has a strong family history of diabetes- mother, brother and sister. She lost 8# in the past month by making many very healthful diet changes.  She is active in her work, but not outside of work. Her employer has group fitness classes and room that she can use.  WEIGHT History: mostly overweight, 288# one time when she was walking outside of work, but her knees started to hurt so she stopped and regained the weight she had lost. Goals 200# long term , 250# in the next year and < 288# in the next few months,  RBW- ~ 180-220# at 69" Preferred Learning Style:No preference indicated  Learning Readiness: Ready and  Change in progress SLEEP: does not sleep well, lays down from 8PM to 9 PM, goes to sleep with TV ob, gets up two times a night and does not fall back asleep readily, up by 3 am to be at work by 4 am, naps during the day, Had sleep study, used CPAP for 3-4 months then stopped because she didn't like it.  MEDICATIONS: wants to be on less medicine DIETARY INTAKE: Usual eating pattern includes 2 meals and 3 snacks per day. 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   Nutritional Diagnosis:  NB-1.4 Self-monitoring deficit As related to  lack way to track her progress .  As evidenced by her report of having a scale but not using if often. NB-2.1 Physical inactivity As related to competing values, lack of sleep and pain in knees.  As evidenced by her report of little activity outside of work. .    Intervention:  Nutrition education about:   Eat 3 meals/day, Avoid meal skipping   Increase protein rich foods  Follow "Plate Method" for portion control   Choose more whole grains, lean protein, low-fat dairy, and fruits/non-starchy vegetables.   Aim for >30 min of physical activity daily.  Also, need for support, to adopt a self monitoring plan and  regular exercise  Teaching Method Utilized: Visual,,Auditory,Hands on Handouts given during visit include:plate method , weight chart, opportunities for exercise in community Barriers to learning/adherence to lifestyle change: sleeping problems, support, competing values Demonstrated degree of understanding via:  Teach Back   Monitoring/Evaluation:  Dietary intake, exercise, and body weight in 2 week(s).

## 2014-07-24 NOTE — Progress Notes (Signed)
   Subjective:   Patient ID: Emily Schmitt female   DOB: 05-19-1960 54 y.o.   MRN: [id number]  HPI: EmilyArtesha Viona Gilmore Schmitt is a 54 y.o. woman pmh as listed below presents for HTN follow up.   Hypertension ROS: taking medications as instructed, no medication side effects noted, no TIA's, no chest pain on exertion, no dyspnea on exertion and no swelling of ankles. The patient has been actively trying to lose weight and has had some success. She continues to make lifestyle changes with her diet and exercise.  New concerns: She has uterine fibroids and has been having some increased bleeding that sometimes makes her feel slightly fatigued. She was present with the option of hysterectomy but is deferring at this time and has not been started on any hormonal therapy by her OB/GYN. She does not have any other sites of gross bleeding, has not had any syncope or presyncopal feelings. She denied any chest pain, short of breath, dyspnea on exertion, headaches, or blurry vision.   Past Medical History  Diagnosis Date  . Hypertension   . OSA (obstructive sleep apnea) 2006    Dx 7/06 with RDI 145.6 and desat to 62%. CPAP at 9 reduced RDI to 7.1. Repeat testing Aug 2006 showed RDI 0.7 on CPAP of 0.7  . Obesity   . Osteoarthritis     Of knees B. Uses hydrocodone sparingly.   . Allergic rhinitis   . Osteoarthritis    Current Outpatient Prescriptions  Medication Sig Dispense Refill  . ferrous sulfate 325 (65 FE) MG tablet Take 1 tablet (325 mg total) by mouth daily. 30 tablet 3  . hydrochlorothiazide (HYDRODIURIL) 25 MG tablet Take 1 tablet (25 mg total) by mouth daily. 90 tablet 3  . metoprolol succinate (TOPROL XL) 50 MG 24 hr tablet Take 1 tablet (50 mg total) by mouth daily. Take with or immediately following a meal. 90 tablet 3  . naproxen sodium (ANAPROX) 220 MG tablet Take 2 tablets (440 mg total) by mouth 2 (two) times daily as needed (headache). 60 tablet 1   No current facility-administered  medications for this visit.   Family History  Problem Relation Age of Onset  . Heart murmur Sister   . CVA Mother    History   Social History  . Marital Status: Married    Spouse Name: N/A  . Number of Children: N/A  . Years of Education: N/A   Occupational History  . Housekeeping supervisor [employer]   Social History Main Topics  . Smoking status: Never Smoker   . Smokeless tobacco: Never Used  . Alcohol Use: 0.0 oz/week    0 Standard drinks or equivalent per week  . Drug Use: No  . Sexual Activity: Yes   Other Topics Concern  . None   Social History Narrative   Review of Systems: Pertinent items are noted in HPI. Objective:  Physical Exam: Filed Vitals:   07/24/14 0941  BP: 126/57  Pulse: 81  Temp: 98.2 F (36.8 C)  TempSrc: Oral  Height: 5' 11.5" (1.816 m)  Weight: 301 lb (136.533 kg)  SpO2: 100%   General: sitting in chair, NAD HEENT: no significant pallor Cardiac: RRR, no rubs, murmurs or gallops Pulm: clear to auscultation bilaterally, moving normal volumes of air Ext: warm and well perfused, no pedal edema  Assessment & Plan:  Please see problem oriented charting  Pt discussed with Dr. Lynnae Schmitt

## 2014-07-24 NOTE — Patient Instructions (Signed)
Please try to have Light yogurt ( less sugar) or something that has protein and starch or sugar for breakfast in the morning  Weigh yourself every day.   You may want to call [employer] and see how many nutrition visits they cover and if you have to pay a copay:Ask them about  Medical Nutrition Therapy and codes: [id number]

## 2014-07-26 NOTE — Progress Notes (Signed)
Internal Medicine Clinic Attending  Case discussed with Dr. Sadek soon after the resident saw the patient.  We reviewed the resident's history and exam and pertinent patient test results.  I agree with the assessment, diagnosis, and plan of care documented in the resident's note. 

## 2014-07-29 ENCOUNTER — Ambulatory Visit (INDEPENDENT_AMBULATORY_CARE_PROVIDER_SITE_OTHER): Payer: PRIVATE HEALTH INSURANCE | Admitting: Physician Assistant

## 2014-07-29 ENCOUNTER — Encounter: Payer: Self-pay | Admitting: Physician Assistant

## 2014-07-29 VITALS — BP 110/60 | HR 78 | Ht 71.5 in | Wt 300.0 lb

## 2014-07-29 DIAGNOSIS — G4733 Obstructive sleep apnea (adult) (pediatric): Secondary | ICD-10-CM | POA: Diagnosis not present

## 2014-07-29 DIAGNOSIS — I10 Essential (primary) hypertension: Secondary | ICD-10-CM | POA: Diagnosis not present

## 2014-07-29 DIAGNOSIS — I471 Supraventricular tachycardia: Secondary | ICD-10-CM | POA: Diagnosis not present

## 2014-07-29 DIAGNOSIS — E876 Hypokalemia: Secondary | ICD-10-CM | POA: Diagnosis not present

## 2014-07-29 NOTE — Progress Notes (Signed)
Cardiology Office Note   Date:  07/29/2014   ID:  Kathy, Wahid [birth year], MRN [id number]  PCP:  Jacques Earthly, MD  Cardiologist:  Dr. Loralie Champagne     Chief Complaint  Patient presents with  . Follow-up    SVT     History of Present Illness: Emily Schmitt is a 54 y.o. female with a hx of HTN, SVT.  She went to the ED in 12/2011 with a racing heart but was in NSR in the ED.  She had a ETT-Echo the next day and developed regular SVT on the treadmill.  She was started on beta-blocker without recurrence.  Last seen by Dr. Loralie Champagne in 01/2012.    She has overall been doing well. She did go to the emergency room in March for left lower extremity edema. Duplex was negative for DVT. She had been off of her HCTZ. She denies any recurrent palpitations.  The patient denies any chest pain, significant dyspnea, syncope, orthopnea, PND, edema.   Studies/Reports Reviewed Today:  LE Venous Duplex 3/16 Summary:  No evidence of deep vein thrombosis involving the left lower extremity.  ETT-Echo 12/2011 Impressions:- No evidence for ischemia or infarction by stress echoimages. However, there was regular SVT with rate 200during exercise.  Event monitor 12/2011 No arrhythmias  Myoview 12/2001 IMPRESSION 1. BREAST ATTENUATION OF THE ANTERIOR WALL. NO EVIDENCE OF INFARCTION OR ISCHEMIA. 2. ESTIMATED EJECTION FRACTION 63%.  Past Medical History  Diagnosis Date  . Hypertension   . OSA (obstructive sleep apnea) 2006    Dx 7/06 with RDI 145.6 and desat to 62%. CPAP at 11 reduced RDI to 7.1. Repeat testing Aug 2006 showed RDI 0.7 on CPAP of 0.7  . Obesity   . Osteoarthritis     Of knees B. Uses hydrocodone sparingly.   . Allergic rhinitis   . Osteoarthritis   1. SVT 2. HTN 3. OSA 4. OA knees 5. Obesity 6. Stress echo 11/13 with no evidence for ischemia or infarction.    Past Surgical History  Procedure Laterality Date  . Tubal ligation       Current Outpatient  Prescriptions  Medication Sig Dispense Refill  . ferrous sulfate 325 (65 FE) MG tablet Take 1 tablet (325 mg total) by mouth daily. 30 tablet 3  . hydrochlorothiazide (HYDRODIURIL) 25 MG tablet Take 1 tablet (25 mg total) by mouth daily. 90 tablet 3  . metoprolol succinate (TOPROL XL) 50 MG 24 hr tablet Take 1 tablet (50 mg total) by mouth daily. Take with or immediately following a meal. 90 tablet 3   No current facility-administered medications for this visit.    Allergies:   Review of patient's allergies indicates no known allergies.    Social History:  The patient  reports that she has never smoked. She has never used smokeless tobacco. She reports that she drinks alcohol. She reports that she does not use illicit drugs.   Family History:  The patient's family history includes CVA in her mother; Diabetes in her brother, mother, and sister; Heart murmur in her sister; Hypertension in her brother and sister; Stroke in her mother. There is no history of Heart attack.    ROS:   Please see the history of present illness.   Review of Systems  Constitution: Positive for decreased appetite.  Cardiovascular: Positive for leg swelling.  All other systems reviewed and are negative.    PHYSICAL EXAM: VS:  BP 110/60 mmHg  Pulse 78  Ht  5' 11.5" (1.816 m)  Wt 300 lb (136.079 kg)  BMI 41.26 kg/m2  LMP 06/23/2014    Wt Readings from Last 3 Encounters:  07/29/14 300 lb (136.079 kg)  07/24/14 301 lb (136.533 kg)  06/25/14 309 lb 3.2 oz (140.252 kg)     GEN: Well nourished, well developed, in no acute distress HEENT: normal Neck: no JVD, no masses Cardiac:  Normal S1/S2, RRR; no murmur ,  no rubs or gallops, trace bilateral LE edema   Respiratory:  clear to auscultation bilaterally, no wheezing, rhonchi or rales. GI: soft, nontender, nondistended, + BS MS: no deformity or atrophy Skin: warm and dry  Neuro:  CNs II-XII intact, Strength and sensation are intact Psych: Normal  affect   EKG:  EKG is ordered today.  It demonstrates:   NSR, HR 78, normal axis, nonspecific ST-T wave change   Recent Labs: 07/24/2014: BUN 14; Creatinine 0.70; Hemoglobin 9.0*; Platelets 312; Potassium 3.4*; Sodium 134*    Lipid Panel    Component Value Date/Time   CHOL 150 06/23/2008 1644   TRIG 71 06/23/2008 1644   HDL 51 06/23/2008 1644   CHOLHDL 2.9 Ratio 06/23/2008 1644   VLDL 14 06/23/2008 1644   LDLCALC 85 06/23/2008 1644      ASSESSMENT AND PLAN:  SVT (supraventricular tachycardia):  No recurrence on beta blocker therapy. Continue current dose of Toprol-XL. If she has recurrent SVT in the future, consider referral to EP for possible ablation. For now, with overall quiescence of SVT, continue medical therapy.  Essential hypertension:  Controlled.  OSA (obstructive sleep apnea):  She was taken off of CPAP therapy in the past.   Hypokalemia:  Recent K+ 3.4.  Increase dietary K+.  Repeat BMET 2 weeks.    Current medicines are reviewed at length with the patient today.  Concerns regarding medicines are as outlined above.  The following changes have been made:    None    Labs/ tests ordered today include:  Orders Placed This Encounter  Procedures  . Basic Metabolic Panel (BMET)  . EKG 12-Lead    Disposition:   FU with Dr. Loralie Champagne 1 year.   Signed, Versie Starks, MHS 07/29/2014 2:18 PM    [facility] [address], [place], [place]  [address] Phone: [phone]; Fax: [phone]

## 2014-07-29 NOTE — Patient Instructions (Signed)
Medication Instructions:  Your physician recommends that you continue on your current medications as directed. Please refer to the Current Medication list given to you today.  Labwork: Your physician recommends that you return for lab work in: 08/13/14 at your dietary appointment.  Testing/Procedures: None  Follow-Up: Your physician wants you to follow-up in: 1 year with Dr. Aundra Schmitt. You will receive a reminder letter in the mail two months in advance. If you don't receive a letter, please call our office to schedule the follow-up appointment.  Any Other Special Instructions Will Be Listed Below (If Applicable).  Potassium Content of Foods Potassium is a mineral found in many foods and drinks. It helps keep fluids and minerals balanced in your body and affects how steadily your heart beats. Potassium also helps control your blood pressure and keep your muscles and nervous system healthy. Certain health conditions and medicines may change the balance of potassium in your body. When this happens, you can help balance your level of potassium through the foods that you do or do not eat. Your health care provider or dietitian may recommend an amount of potassium that you should have each day. The following lists of foods provide the amount of potassium (in parentheses) per serving in each item. HIGH IN POTASSIUM  The following foods and beverages have 200 mg or more of potassium per serving:  Apricots, 2 raw or 5 dry (200 mg).  Artichoke, 1 medium (345 mg).  Avocado, raw,  each (245 mg).  Banana, 1 medium (425 mg).  Beans, lima, or baked beans, canned,  cup (280 mg).  Beans, white, canned,  cup (595 mg).  Beef roast, 3 oz (320 mg).  Beef, ground, 3 oz (270 mg).  Beets, raw or cooked,  cup (260 mg).  Bran muffin, 2 oz (300 mg).  Broccoli,  cup (230 mg).  Brussels sprouts,  cup (250 mg).  Cantaloupe,  cup (215 mg).  Cereal, 100% bran,  cup (200-400 mg).  Cheeseburger,  single, fast food, 1 each (225-400 mg).  Chicken, 3 oz (220 mg).  Clams, canned, 3 oz (535 mg).  Crab, 3 oz (225 mg).  Dates, 5 each (270 mg).  Dried beans and peas,  cup (300-475 mg).  Figs, dried, 2 each (260 mg).  Fish: halibut, tuna, cod, snapper, 3 oz (480 mg).  Fish: salmon, haddock, swordfish, perch, 3 oz (300 mg).  Fish, tuna, canned 3 oz (200 mg).  [place] fries, fast food, 3 oz (470 mg).  Granola with fruit and nuts,  cup (200 mg).  Grapefruit juice,  cup (200 mg).  Greens, beet,  cup (655 mg).  Honeydew melon,  cup (200 mg).  Kale, raw, 1 cup (300 mg).  Kiwi, 1 medium (240 mg).  Kohlrabi, rutabaga, parsnips,  cup (280 mg).  Lentils,  cup (365 mg).  Mango, 1 each (325 mg).  Milk, chocolate, 1 cup (420 mg).  Milk: nonfat, low-fat, whole, buttermilk, 1 cup (350-380 mg).  Molasses, 1 Tbsp (295 mg).  Mushrooms,  cup (280) mg.  Nectarine, 1 each (275 mg).  Nuts: almonds, peanuts, hazelnuts, [place], cashew, mixed, 1 oz (200 mg).  Nuts, pistachios, 1 oz (295 mg).  Orange, 1 each (240 mg).  Orange juice,  cup (235 mg).  Papaya, medium,  fruit (390 mg).  Peanut butter, chunky, 2 Tbsp (240 mg).  Peanut butter, smooth, 2 Tbsp (210 mg).  Pear, 1 medium (200 mg).  Pomegranate, 1 whole (400 mg).  Pomegranate juice,  cup (215 mg).  Pork, 3 oz (350 mg).  Potato chips, salted, 1 oz (465 mg).  Potato, baked with skin, 1 medium (925 mg).  Potatoes, boiled,  cup (255 mg).  Potatoes, mashed,  cup (330 mg).  Prune juice,  cup (370 mg).  Prunes, 5 each (305 mg).  Pudding, chocolate,  cup (230 mg).  Pumpkin, canned,  cup (250 mg).  Raisins, seedless,  cup (270 mg).  Seeds, sunflower or pumpkin, 1 oz (240 mg).  Soy milk, 1 cup (300 mg).  Spinach,  cup (420 mg).  Spinach, canned,  cup (370 mg).  Sweet potato, baked with skin, 1 medium (450 mg).  Swiss chard,  cup (480 mg).  Tomato or vegetable juice,  cup (275  mg).  Tomato sauce or puree,  cup (400-550 mg).  Tomato, raw, 1 medium (290 mg).  Tomatoes, canned,  cup (200-300 mg).  [place], 3 oz (250 mg).  Wheat germ, 1 oz (250 mg).  Winter squash,  cup (250 mg).  Yogurt, plain or fruited, 6 oz (260-435 mg).  Zucchini,  cup (220 mg). MODERATE IN POTASSIUM The following foods and beverages have 50-200 mg of potassium per serving:  Apple, 1 each (150 mg).  Apple juice,  cup (150 mg).  Applesauce,  cup (90 mg).  Apricot nectar,  cup (140 mg).  Asparagus, small spears,  cup or 6 spears (155 mg).  Bagel, cinnamon raisin, 1 each (130 mg).  Bagel, egg or plain, 4 in., 1 each (70 mg).  Beans, green,  cup (90 mg).  Beans, yellow,  cup (190 mg).  Beer, regular, 12 oz (100 mg).  Beets, canned,  cup (125 mg).  Blackberries,  cup (115 mg).  Blueberries,  cup (60 mg).  Bread, whole wheat, 1 slice (70 mg).  Broccoli, raw,  cup (145 mg).  Cabbage,  cup (150 mg).  Carrots, cooked or raw,  cup (180 mg).  Cauliflower, raw,  cup (150 mg).  Celery, raw,  cup (155 mg).  Cereal, bran flakes, cup (120-150 mg).  Cheese, cottage,  cup (110 mg).  Cherries, 10 each (150 mg).  Chocolate, 1 oz bar (165 mg).  Coffee, brewed 6 oz (90 mg).  Corn,  cup or 1 ear (195 mg).  Cucumbers,  cup (80 mg).  Egg, large, 1 each (60 mg).  Eggplant,  cup (60 mg).  Endive, raw, cup (80 mg).  English muffin, 1 each (65 mg).  Fish, orange roughy, 3 oz (150 mg).  Frankfurter, beef or pork, 1 each (75 mg).  Fruit cocktail,  cup (115 mg).  Grape juice,  cup (170 mg).  Grapefruit,  fruit (175 mg).  Grapes,  cup (155 mg).  Greens: kale, turnip, collard,  cup (110-150 mg).  Ice cream or frozen yogurt, chocolate,  cup (175 mg).  Ice cream or frozen yogurt, vanilla,  cup (120-150 mg).  Lemons, limes, 1 each (80 mg).  Lettuce, all types, 1 cup (100 mg).  Mixed vegetables,  cup (150 mg).  Mushrooms,  raw,  cup (110 mg).  Nuts: walnuts, pecans, or macadamia, 1 oz (125 mg).  Oatmeal,  cup (80 mg).  Okra,  cup (110 mg).  Onions, raw,  cup (120 mg).  Peach, 1 each (185 mg).  Peaches, canned,  cup (120 mg).  Pears, canned,  cup (120 mg).  Peas, green, frozen,  cup (90 mg).  Peppers, green,  cup (130 mg).  Peppers, red,  cup (160 mg).  Pineapple juice,  cup (165 mg).  Pineapple, fresh or  canned,  cup (100 mg).  Plums, 1 each (105 mg).  Pudding, vanilla,  cup (150 mg).  Raspberries,  cup (90 mg).  Rhubarb,  cup (115 mg).  Rice, wild,  cup (80 mg).  Shrimp, 3 oz (155 mg).  Spinach, raw, 1 cup (170 mg).  Strawberries,  cup (125 mg).  Summer squash  cup (175-200 mg).  Swiss chard, raw, 1 cup (135 mg).  Tangerines, 1 each (140 mg).  Tea, brewed, 6 oz (65 mg).  Turnips,  cup (140 mg).  Watermelon,  cup (85 mg).  Wine, red, table, 5 oz (180 mg).  Wine, white, table, 5 oz (100 mg). LOW IN POTASSIUM The following foods and beverages have less than 50 mg of potassium per serving.  Bread, white, 1 slice (30 mg).  Carbonated beverages, 12 oz (less than 5 mg).  Cheese, 1 oz (20-30 mg).  Cranberries,  cup (45 mg).  Cranberry juice cocktail,  cup (20 mg).  Fats and oils, 1 Tbsp (less than 5 mg).  Hummus, 1 Tbsp (32 mg).  Nectar: papaya, mango, or pear,  cup (35 mg).  Rice, white or brown,  cup (50 mg).  Spaghetti or macaroni,  cup cooked (30 mg).  Tortilla, flour or corn, 1 each (50 mg).  Waffle, 4 in., 1 each (50 mg).  Water chestnuts,  cup (40 mg). Document Released: 09/27/2004 Document Revised: 02/18/2013 Document Reviewed: 01/10/2013 [facility] Patient Information 2015 [place], [place]. This information is not intended to replace advice given to you by your health care provider. Make sure you discuss any questions you have with your health care provider.

## 2014-08-12 ENCOUNTER — Telehealth: Payer: Self-pay | Admitting: Dietician

## 2014-08-12 ENCOUNTER — Telehealth: Payer: Self-pay | Admitting: *Deleted

## 2014-08-12 NOTE — Telephone Encounter (Signed)
error 

## 2014-08-12 NOTE — Telephone Encounter (Signed)
Call to patient to confirm appointment for 08/13/14 at 1:30 phone is disconnected

## 2014-08-13 ENCOUNTER — Ambulatory Visit: Payer: PRIVATE HEALTH INSURANCE | Admitting: Dietician

## 2014-08-17 ENCOUNTER — Other Ambulatory Visit: Payer: Self-pay | Admitting: Internal Medicine

## 2014-08-17 ENCOUNTER — Ambulatory Visit (INDEPENDENT_AMBULATORY_CARE_PROVIDER_SITE_OTHER): Payer: PRIVATE HEALTH INSURANCE | Admitting: Dietician

## 2014-08-17 ENCOUNTER — Ambulatory Visit: Payer: PRIVATE HEALTH INSURANCE | Admitting: Dietician

## 2014-08-17 VITALS — Wt 306.2 lb

## 2014-08-17 DIAGNOSIS — Z6841 Body Mass Index (BMI) 40.0 and over, adult: Secondary | ICD-10-CM | POA: Diagnosis not present

## 2014-08-17 DIAGNOSIS — R739 Hyperglycemia, unspecified: Secondary | ICD-10-CM

## 2014-08-17 DIAGNOSIS — Z713 Dietary counseling and surveillance: Secondary | ICD-10-CM

## 2014-08-17 LAB — POCT GLYCOSYLATED HEMOGLOBIN (HGB A1C): HEMOGLOBIN A1C: 6.3

## 2014-08-17 LAB — GLUCOSE, CAPILLARY: Glucose-Capillary: 147 mg/dL — ABNORMAL HIGH (ref 65–99)

## 2014-08-17 NOTE — Progress Notes (Signed)
   Medical Nutrition Therapy:  Appt start time: 133 end time:  215 PM.  Assessment:  Primary concerns today: weight loss Patient wants to decrease her weight. She weighed every other day. She has observed that her weight goes up and down. She feels like she is eating smaller portions, more vegetables, more water. Patient's PCP gave permission for her to have an A1c done today and she  is interested in having it done.  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 She is thinking about walking with family a few days a week.   WEIGHT TODAY: 306.2# she feels some is water weight,  she feels swollen Goals 200# long term , 250# in the next year and ~ 280# in the next few months,  SLEEP: does not sleep well, need to follow up re use of CPAP  DIETARY INTAKE: 24-hr recall:done and about the same as last visit, mostly healthy choices and reports moderate portions   Estimated energy needs: ~1800-2000 calories/day for weight loss  Progress Towards Goal(s):  In progress.   Nutritional Diagnosis:  NB-1.4 Self-monitoring deficit As related to lack way to track her progress improved As evidenced by her report of weighing herself every other day. NB-2.1 Physical inactivity As related to competing values, lack of sleep and pain in knees without change  As evidenced by her report of little activity outside of work. .    Intervention:  Nutrition education about:   What Calories are and why they are important ot weight loss and her calorie needs  How to keep a food record  Teaching Method Utilized: Visual,,Auditory,Hands on Handouts given during visit include:plate: preventing diabetes booklets with food record booklet Barriers to learning/adherence to lifestyle change: sleeping problems, support, competing values Demonstrated degree of understanding via:  Teach Back   Monitoring/Evaluation:  Dietary intake, exercise, and body weight in 3 week(s).

## 2014-08-17 NOTE — Patient Instructions (Signed)
Your weight today was 306#  Good job weighing yourself! That was your first goal and you met it!!  Your next goal is to write down every thing you eat and drink for 7 days. Please include 2 work days and 2 off days.   your goal weight is to 280# this will help your blood sugars.

## 2014-08-29 ENCOUNTER — Encounter (HOSPITAL_COMMUNITY): Payer: Self-pay | Admitting: Emergency Medicine

## 2014-08-29 ENCOUNTER — Emergency Department (HOSPITAL_COMMUNITY)
Admission: EM | Admit: 2014-08-29 | Discharge: 2014-08-29 | Disposition: A | Discharge status: Home / Self Care | Payer: PRIVATE HEALTH INSURANCE | Source: Home / Self Care | Attending: Emergency Medicine | Admitting: Emergency Medicine

## 2014-08-29 ENCOUNTER — Emergency Department (INDEPENDENT_AMBULATORY_CARE_PROVIDER_SITE_OTHER): Payer: PRIVATE HEALTH INSURANCE

## 2014-08-29 DIAGNOSIS — M7662 Achilles tendinitis, left leg: Secondary | ICD-10-CM

## 2014-08-29 MED ORDER — NAPROXEN 500 MG PO TABS: 500.0000 mg | ORAL_TABLET | Freq: Two times a day (BID) | ORAL | Status: DC

## 2014-08-29 NOTE — ED Notes (Signed)
C/o left heel pain onset 6/30... Denies inj/trauma Pain increases w/activity Ambulated well to exam room w/NAD Alert, no acute distress.

## 2014-08-29 NOTE — ED Provider Notes (Signed)
CSN: [id number]     Arrival date & time 08/29/14  1539 History   First MD Initiated Contact with Patient 08/29/14 1837     Chief Complaint  Patient presents with  . Foot Pain   (Consider location/radiation/quality/duration/timing/severity/associated sxs/prior Treatment) HPI She is a 54 year old woman here for evaluation of left foot pain. She states this started about 3 days ago. She denies any injury or trauma. 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 The pain is located in the back of the left heel. It is worse with weightbearing. It will go up the back of her leg a little bit. She denies any new shoes.  Past Medical History  Diagnosis Date  . Hypertension   . OSA (obstructive sleep apnea) 2006    Dx 7/06 with RDI 145.6 and desat to 62%. CPAP at 11 reduced RDI to 7.1. Repeat testing Aug 2006 showed RDI 0.7 on CPAP of 0.7  . Obesity   . Osteoarthritis     Of knees B. Uses hydrocodone sparingly.   . Allergic rhinitis   . Osteoarthritis    Past Surgical History  Procedure Laterality Date  . Tubal ligation     Family History  Problem Relation Age of Onset  . Heart murmur Sister   . CVA Mother   . Diabetes Mother   . Diabetes Sister   . Diabetes Brother   . Stroke Mother   . Heart attack Neg Hx   . Hypertension Sister   . Hypertension Brother    History  Substance Use Topics  . Smoking status: Never Smoker   . Smokeless tobacco: Never Used  . Alcohol Use: 0.0 oz/week    0 Standard drinks or equivalent per week   OB History    Gravida Para Term Preterm AB TAB SAB Ectopic Multiple Living   2 2 2       2      Review of Systems As in history of present illness Allergies  Review of patient's allergies indicates no known allergies.  Home Medications   Prior to Admission medications   Medication Sig Start Date End Date Taking? Authorizing Provider  ferrous sulfate 325 (65 FE) MG tablet Take 1 tablet  (325 mg total) by mouth daily. 07/24/14 07/24/15  Jerrye Noble, MD  hydrochlorothiazide (HYDRODIURIL) 25 MG tablet Take 1 tablet (25 mg total) by mouth daily. 07/24/14   Jerrye Noble, MD  metoprolol succinate (TOPROL XL) 50 MG 24 hr tablet Take 1 tablet (50 mg total) by mouth daily. Take with or immediately following a meal. 07/24/14   Jerrye Noble, MD  naproxen (NAPROSYN) 500 MG tablet Take 1 tablet (500 mg total) by mouth 2 (two) times daily. 08/29/14   Melony Overly, MD   BP 186/94 mmHg  Pulse 97  Temp(Src) 98.7 F (37.1 C) (Oral)  Resp 20  SpO2 97%  LMP 08/24/2014 Physical Exam  Constitutional: She is oriented to person, place, and time. She appears well-developed and well-nourished. No distress.  Cardiovascular: Normal rate.   Pulmonary/Chest: Effort normal.  Musculoskeletal:  Left foot: No erythema. She has some mild swelling at the posterior heel. She has a tender nodule at the insertion site of the Achilles tendon.  She is tender along the Achilles tendon. She has 5 out of 5 strength in dorsiflexion and plantar flexion. She has pain with resisted plantar flexion.   Neurological: She is alert and oriented  to person, place, and time.    ED Course  Procedures (including critical care time) Labs Review Labs Reviewed - No data to display  Imaging Review Dg Foot Complete Left  08/29/2014   CLINICAL DATA:  Pain in the hindfoot, beginning 3 days ago. Unable to stand.  EXAM: LEFT FOOT - COMPLETE 3+ VIEW  COMPARISON:  None.  FINDINGS: No abnormality in the forefoot. There is some chronic thickening of the cortex of the fifth metatarsal that could go along with chronic stress reaction. There are coarse calcifications in the lateral tendons which are chronic. There is calcification in the distal Achilles tendon which is chronic. There are midfoot degenerative changes. Small plantar calcaneal spur.  IMPRESSION: No acute or traumatic finding. Chronic calcifications in the lateral tendons and in the  distal Achilles tendon. Midfoot degenerative changes. Small plantar calcaneal spur.   Electronically Signed   By: Nelson Chimes M.D.   On: 08/29/2014 19:26     MDM   1. Achilles tendinitis of left lower extremity    Treat with rest, ice, Naprosyn. Cam Walker boot given for comfort. Work note provided. If no improvement in the next 2 weeks, follow-up with sports medicine.    Melony Overly, MD 08/29/14 [phone]

## 2014-08-29 NOTE — Discharge Instructions (Signed)
You have arthritis in your foot. You also have Achilles tendinitis. This is what is causing most of your pain right now. Wear the Cam Walker boot when you are walking around. Take Naprosyn twice a day for the next week, then as needed. Apply ice frequently. Get some heel cups to wear in your shoes. This will take some of the strain off your Achilles tendon. If no improvement in the next 2 weeks, please follow-up with sports medicine.

## 2014-09-15 ENCOUNTER — Telehealth: Payer: Self-pay | Admitting: Dietician

## 2014-09-15 NOTE — Telephone Encounter (Signed)
Call to patient to confirm appointment for 09/16/14 at 2:30 lmtcb

## 2014-09-16 ENCOUNTER — Encounter: Payer: Self-pay | Admitting: Dietician

## 2014-09-16 ENCOUNTER — Ambulatory Visit (INDEPENDENT_AMBULATORY_CARE_PROVIDER_SITE_OTHER): Payer: PRIVATE HEALTH INSURANCE | Admitting: Dietician

## 2014-09-16 VITALS — Wt 295.3 lb

## 2014-09-16 DIAGNOSIS — Z713 Dietary counseling and surveillance: Secondary | ICD-10-CM | POA: Diagnosis not present

## 2014-09-16 DIAGNOSIS — Z6841 Body Mass Index (BMI) 40.0 and over, adult: Secondary | ICD-10-CM

## 2014-09-16 NOTE — Patient Instructions (Signed)
Dr. Jacques Earthly is your assigned doctor here.   For your knee: You can call the [facility] office and ask them if you need a referral.If you need a referral you can call our office to request one.   You did great at losing 10 pounds in 4 weeks.  1. Keep weighing yourself every day.  2. Keep writing down what you eat- 3 days a week at least.   IF you continue to weigh the same- consider; less bread, starches, diet sodas.    You body is meant to carry about 176#-200#.   4. Your goal is to work out more- class on [place] for exercise opportunities in your day:  Never lie down when you can sit; never sit when you can stand; never stand when you can pace.    Moving your body throughout the day is just as important as the 30 or 60 minutes of exercise at the gym!

## 2014-09-16 NOTE — Progress Notes (Signed)
   Medical Nutrition Therapy:  Appt start time: 133 end time:  215 PM.  Assessment:  Primary concerns today: weight loss Patient wants to decrease her weight. She weighed every other day.  She feels her weight loss was due to smaller portions, more vegetables, more water.  A1c done last visit and was 6.3%.  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 She'd like to walk but her left knee is popping and painful and is preventing her from any type of activity other than work and stretching.     Filed Weights   09/16/14 1425  Weight: 295 lb 4.8 oz (133.947 kg)   Goals 200# long term , 250# in the next year and ~ 280# in the next few months,  SLEEP: does not sleep well, need to follow up re use of CPAP  DIETARY INTAKE: 24-hr recall:done and about the same as last visit, mostly healthy choices and reports moderate portions   Estimated energy needs: ~1800-2000 calories/day for weight loss  Progress Towards Goal(s):  In progress.   Nutritional Diagnosis:  NB-1.4 Self-monitoring deficit As related to lack way to track her progress improved As evidenced by her report of weighing herself every other day. NB-2.1 Physical inactivity As related to competing values, lack of sleep and pain in knees has improved some As evidenced by her report of attending a [place] at work weekly, but stil not moving outside of work.     Intervention:  Nutrition education about:   Importance of self monitoring and exercise to weight loss success    Teaching Method Utilized: Visual,Auditory,Hands on using food models Handouts given during visit include:meal planning book with calorie content of foods and 1800 calorie meal plan Barriers to learning/adherence to lifestyle change: sleeping problems, support, competing values Demonstrated degree of understanding via:  Teach Back   Monitoring/Evaluation:  Dietary intake,  exercise, and body weight in 4 week(s).

## 2014-10-21 ENCOUNTER — Ambulatory Visit (INDEPENDENT_AMBULATORY_CARE_PROVIDER_SITE_OTHER): Payer: PRIVATE HEALTH INSURANCE | Admitting: Dietician

## 2014-10-21 VITALS — Wt 289.3 lb

## 2014-10-21 DIAGNOSIS — Z6841 Body Mass Index (BMI) 40.0 and over, adult: Secondary | ICD-10-CM

## 2014-10-21 DIAGNOSIS — Z713 Dietary counseling and surveillance: Secondary | ICD-10-CM

## 2014-10-21 NOTE — Progress Notes (Signed)
   Medical Nutrition Therapy:  Appt start time: 1530 end time:  1600   Assessment:  Primary concerns today: weight loss Patient is working on weight loss. She weighs every other day.  She eats smaller portions, more vegetables, more water.  Her weight loss is appropriate to prevent Type 2 diabetes She reports her knee pain is better (had and injection), her clothes fit better and she feels better about herself. Rates her self confidence in begin able to continue weight loss at 4/5          She has lost 20# since Jun 28, 2014 (~15 weeks)  Filed Weights   10/21/14 1707  Weight: 289 lb 4.8 oz (131.226 kg)   Goals 200# long term , 250# in the next year and ~ 280# in the next few months,  SLEEP: does not sleep well, need to follow up re use of CPAP  DIETARY INTAKE: 24-hr recall:done and about the same as last visit, mostly healthy choices and reports moderate portions   Estimated energy needs: ~1800-2000 calories/day for weight loss  Progress Towards Goal(s):  In progress.   Nutritional Diagnosis:  NB-1.4 Self-monitoring deficit As related to lack way to track her progress improved As evidenced by her report of weighing herself every other day. NB-2.1 Physical inactivity As related to competing values, lack of sleep and pain in knees has improved As evidenced by her report of attending a madatory stretch class at work weekly and walking 8 laps ( 2 miles) 3 days a week.     Intervention:  Nutrition education about:   Importance of support to keep her going and motivated  How weight loss can plateau and what to do about it  Teaching Method Utilized: Visual,Auditory,Hands on using food models Handouts given during visit include:meal planning book with calorie content of foods and 1800 calorie meal plan Barriers to learning/adherence to lifestyle change: sleeping problems, support, competing values Demonstrated degree of understanding via:  Teach Back   Monitoring/Evaluation:  Dietary  intake, exercise, and body weight in 4 week(s). Patient has 1 more visit this year which will be confirmed. Plan os to follow up by phone and seek alternative means fo support.

## 2014-10-21 NOTE — Patient Instructions (Signed)
Keep walking, weighing every other day and limiting your food portions!  Your weight loss is right on track!  I will call about your last visit and we can keep working together over the phone.

## 2014-11-10 ENCOUNTER — Telehealth: Payer: Self-pay | Admitting: Dietician

## 2014-11-10 NOTE — Telephone Encounter (Signed)
Called [employer] Open access plus plan per patient request to inquire about her coverage for Medical Nutrition therapy for Obesity:  CPT codes [phone] and [phone] with diagnosis Z68.41- covered with a 50$ copay and after that covered 100%, using NPI [id number].

## 2014-12-01 ENCOUNTER — Telehealth: Payer: Self-pay | Admitting: Dietician

## 2014-12-01 NOTE — Telephone Encounter (Signed)
CDE called [employer] again for Reference number for call: [id number] and clarification:  Limit to number of visits- no limit on visits [phone] and [phone] benefit- in network referral not needed, no maximum, in the facility,100% after deductible is met, no copay applies Must meet deductible $2000.00, met $47.96 towards it for this year  out of pocket is 6350.00, she has met Out of pocket 634.61 this year.

## 2014-12-07 NOTE — Telephone Encounter (Signed)
Called to follow up ion weight loss. Her weight is 288# (1 pound less), feels stuck, her weight has not changed much.  Wants to decrease her weight another 40#. Informed her of her coverage for Nutrition visits. Disucssed her insurance coverage information. She scheduled another visit for this Friday, October 14 at 11 am.

## 2014-12-11 ENCOUNTER — Ambulatory Visit: Payer: PRIVATE HEALTH INSURANCE | Admitting: Dietician

## 2015-01-19 ENCOUNTER — Encounter: Payer: Self-pay | Admitting: Student

## 2015-05-13 ENCOUNTER — Other Ambulatory Visit: Payer: Self-pay | Admitting: Pulmonary Disease

## 2015-05-13 ENCOUNTER — Ambulatory Visit (INDEPENDENT_AMBULATORY_CARE_PROVIDER_SITE_OTHER): Payer: Managed Care, Other (non HMO) | Admitting: Pulmonary Disease

## 2015-05-13 ENCOUNTER — Encounter: Payer: Self-pay | Admitting: Pulmonary Disease

## 2015-05-13 VITALS — BP 127/68 | HR 82 | Temp 98.1°F | Ht 71.5 in | Wt 269.6 lb

## 2015-05-13 DIAGNOSIS — Z23 Encounter for immunization: Secondary | ICD-10-CM

## 2015-05-13 DIAGNOSIS — Z6837 Body mass index (BMI) 37.0-37.9, adult: Secondary | ICD-10-CM | POA: Diagnosis not present

## 2015-05-13 DIAGNOSIS — I10 Essential (primary) hypertension: Secondary | ICD-10-CM | POA: Diagnosis not present

## 2015-05-13 DIAGNOSIS — D649 Anemia, unspecified: Secondary | ICD-10-CM

## 2015-05-13 DIAGNOSIS — E669 Obesity, unspecified: Secondary | ICD-10-CM | POA: Diagnosis not present

## 2015-05-13 DIAGNOSIS — Z1231 Encounter for screening mammogram for malignant neoplasm of breast: Secondary | ICD-10-CM

## 2015-05-13 DIAGNOSIS — Z0000 Encounter for general adult medical examination without abnormal findings: Secondary | ICD-10-CM

## 2015-05-13 MED ORDER — METOPROLOL SUCCINATE ER 50 MG PO TB24: 50.0000 mg | ORAL_TABLET | Freq: Every day | ORAL | Status: DC

## 2015-05-13 MED ORDER — HYDROCHLOROTHIAZIDE 25 MG PO TABS: 25.0000 mg | ORAL_TABLET | Freq: Every day | ORAL | Status: DC

## 2015-05-13 NOTE — Progress Notes (Signed)
   Subjective:    Patient ID: Emily Schmitt, female    DOB: Sep 16, 1960, 55 y.o.   MRN: [id number]  HPI Emily Schmitt is a 55 year old woman with HTN, OSA, Obesity, OA, allergic rhinitis presenting for follow up of hypertension.  She ran out of her medication son Thursday. She started to feel dizzy/lightheaded, headache on Saturday. On Monday she developed subjective fevers, chills, cough productive of clear sputum, rhinnorhea. Denies chest pain or shortness of breath. Her husband was also sick. These symptoms have resolved.   Review of Systems Gastrointestinal: no nausea/vomiting, no abdominal pain, no constipation, no diarrhea Genitourinary: no dysuria, no hematuria Integument: no rash  Past Medical History  Diagnosis Date  . Hypertension   . OSA (obstructive sleep apnea) 2006    Dx 7/06 with RDI 145.6 and desat to 62%. CPAP at 9 reduced RDI to 7.1. Repeat testing Aug 2006 showed RDI 0.7 on CPAP of 0.7  . Obesity   . Osteoarthritis     Of knees B. Uses hydrocodone sparingly.   . Allergic rhinitis   . Osteoarthritis     Current Outpatient Prescriptions on File Prior to Visit  Medication Sig Dispense Refill  . ferrous sulfate 325 (65 FE) MG tablet Take 1 tablet (325 mg total) by mouth daily. 30 tablet 3  . hydrochlorothiazide (HYDRODIURIL) 25 MG tablet Take 1 tablet (25 mg total) by mouth daily. 90 tablet 3  . metoprolol succinate (TOPROL XL) 50 MG 24 hr tablet Take 1 tablet (50 mg total) by mouth daily. Take with or immediately following a meal. 90 tablet 3  . naproxen (NAPROSYN) 500 MG tablet Take 1 tablet (500 mg total) by mouth 2 (two) times daily. 30 tablet 0   No current facility-administered medications on file prior to visit.   Objective:   Physical Exam Blood pressure 127/68, pulse 82, temperature 98.1 F (36.7 C), temperature source Oral, height 5' 11.5" (1.816 m), weight 269 lb 9.6 oz (122.29 kg), SpO2 100 %.  General Apperance: NAD HEENT: Normocephalic,  atraumatic, anicteric sclera Neck: Supple, trachea midline Lungs: Clear to auscultation bilaterally. No wheezes, rhonchi or rales. Breathing comfortably Heart: Regular rate and rhythm, no murmur/rub/gallop Abdomen: Soft, nontender, nondistended, no rebound/guarding Extremities: Warm and well perfused, no edema Skin: No rashes or lesions Neurologic: Alert and interactive. No gross deficits.  Assessment & Plan:  Essential hypertension BP Readings from Last 3 Encounters:  05/13/15 127/68  08/29/14 186/94  07/29/14 110/60    Lab Results  Component Value Date   NA 143 05/13/2015   K 4.1 05/13/2015   CREATININE 0.88 05/13/2015    Assessment: Blood pressure control: Controlled Progress toward BP goal:  At goal  Plan: Medications:  Continue HCTZ 25mg  daily and metoprolol succinate 50mg  daily. Other plans:  -Consider downtitration of medications at next follow up visit.   Anemia Assessment: C/o of fatigue. TSH normal. Microcytic anemia with stable hemoglobin of 9. Ferritin low at 7.  Plan: -Screening colonoscopy referral placed. -Start ferrous sulfate daily.  BMI 37.0-37.9, adult Weight down to 269. Congratulated her on her 20 lb weight loss since August 2016.  Health care maintenance HIV and Hep C screening negative Mammogram ordered Influenza vaccine administered   Jacques Earthly, MD  Internal Medicine Teaching Service PGY-2 05/15/2015 4:32 PM

## 2015-05-14 LAB — BMP8+ANION GAP
Anion Gap: 20 mmol/L — ABNORMAL HIGH (ref 10.0–18.0)
BUN / CREAT RATIO: 16 (ref 9–23)
BUN: 14 mg/dL (ref 6–24)
CHLORIDE: 103 mmol/L (ref 96–106)
CO2: 20 mmol/L (ref 18–29)
Calcium: 9.7 mg/dL (ref 8.7–10.2)
Creatinine, Ser: 0.88 mg/dL (ref 0.57–1.00)
GFR, EST AFRICAN AMERICAN: 84 mL/min/{1.73_m2} (ref >59)
GFR, EST NON AFRICAN AMERICAN: 73 mL/min/{1.73_m2} (ref >59)
Glucose: 98 mg/dL (ref 65–99)
Potassium: 4.1 mmol/L (ref 3.5–5.2)
Sodium: 143 mmol/L (ref 134–144)

## 2015-05-14 LAB — CBC
HEMATOCRIT: 29.9 % — AB (ref 34.0–46.6)
HEMOGLOBIN: 9 g/dL — AB (ref 11.1–15.9)
MCH: 20.6 pg — ABNORMAL LOW (ref 26.6–33.0)
MCHC: 30.1 g/dL — AB (ref 31.5–35.7)
MCV: 69 fL — ABNORMAL LOW (ref 79–97)
NRBC: 0 % (ref 0–0)
Platelets: 337 10*3/uL (ref 150–379)
RBC: 4.36 x10E6/uL (ref 3.77–5.28)
RDW: 21.1 % — ABNORMAL HIGH (ref 12.3–15.4)
WBC: 3.7 10*3/uL (ref 3.4–10.8)

## 2015-05-14 LAB — LIPID PANEL
CHOL/HDL RATIO: 2.6 ratio (ref 0.0–4.4)
Cholesterol, Total: 176 mg/dL (ref 100–199)
HDL: 69 mg/dL (ref >39)
LDL CALC: 85 mg/dL (ref 0–99)
Triglycerides: 110 mg/dL (ref 0–149)
VLDL CHOLESTEROL CAL: 22 mg/dL (ref 5–40)

## 2015-05-14 LAB — HIV ANTIBODY (ROUTINE TESTING W REFLEX): HIV Screen 4th Generation wRfx: NONREACTIVE

## 2015-05-14 LAB — TSH: TSH: 1.57 u[IU]/mL (ref 0.450–4.500)

## 2015-05-14 LAB — HEPATITIS C ANTIBODY

## 2015-05-14 LAB — HEMOGLOBIN A1C
ESTIMATED AVERAGE GLUCOSE: 126 mg/dL
HEMOGLOBIN A1C: 6 % — AB (ref 4.8–5.6)

## 2015-05-14 LAB — FERRITIN: Ferritin: 7 ng/mL — ABNORMAL LOW (ref 15–150)

## 2015-05-15 DIAGNOSIS — Z0000 Encounter for general adult medical examination without abnormal findings: Secondary | ICD-10-CM | POA: Insufficient documentation

## 2015-05-15 MED ORDER — FERROUS SULFATE 325 (65 FE) MG PO TABS: 325.0000 mg | ORAL_TABLET | Freq: Every day | ORAL | Status: DC

## 2015-05-15 NOTE — Assessment & Plan Note (Signed)
Weight down to 269. Congratulated her on her 20 lb weight loss since August 2016.

## 2015-05-15 NOTE — Assessment & Plan Note (Signed)
HIV and Hep C screening negative Mammogram ordered Influenza vaccine administered

## 2015-05-15 NOTE — Assessment & Plan Note (Signed)
BP Readings from Last 3 Encounters:  05/13/15 127/68  08/29/14 186/94  07/29/14 110/60    Lab Results  Component Value Date   NA 143 05/13/2015   K 4.1 05/13/2015   CREATININE 0.88 05/13/2015    Assessment: Blood pressure control: Controlled Progress toward BP goal:  At goal  Plan: Medications:  Continue HCTZ 25mg  daily and metoprolol succinate 50mg  daily. Other plans:  -Consider downtitration of medications at next follow up visit.

## 2015-05-15 NOTE — Assessment & Plan Note (Addendum)
Assessment: C/o of fatigue. TSH normal. Microcytic anemia with stable hemoglobin of 9. Ferritin low at 7.  Plan: -Screening colonoscopy referral placed. -Start ferrous sulfate daily.

## 2015-05-17 NOTE — Progress Notes (Signed)
Internal Medicine Clinic Attending  Case discussed with Dr. Krall soon after the resident saw the patient.  We reviewed the resident's history and exam and pertinent patient test results.  I agree with the assessment, diagnosis, and plan of care documented in the resident's note. 

## 2015-05-26 ENCOUNTER — Ambulatory Visit: Payer: Managed Care, Other (non HMO)

## 2015-06-03 ENCOUNTER — Encounter: Payer: Self-pay | Admitting: *Deleted

## 2015-07-05 NOTE — Addendum Note (Signed)
Addended by: Hulan Fray on: 07/05/2015 06:25 PM   Modules accepted: Orders

## 2015-07-29 NOTE — Addendum Note (Signed)
Addended by: Hulan Fray on: 07/29/2015 09:05 AM   Modules accepted: SmartSet

## 2015-12-20 IMAGING — DX DG FOOT COMPLETE 3+V*L*
3 series · 3 of 3 positions shown · non-contrast
Comparison: None.

CLINICAL DATA: Pain in the hindfoot, beginning 3 days ago. Unable
to stand.

EXAM:
LEFT FOOT - COMPLETE 3+ VIEW

[foot ap]
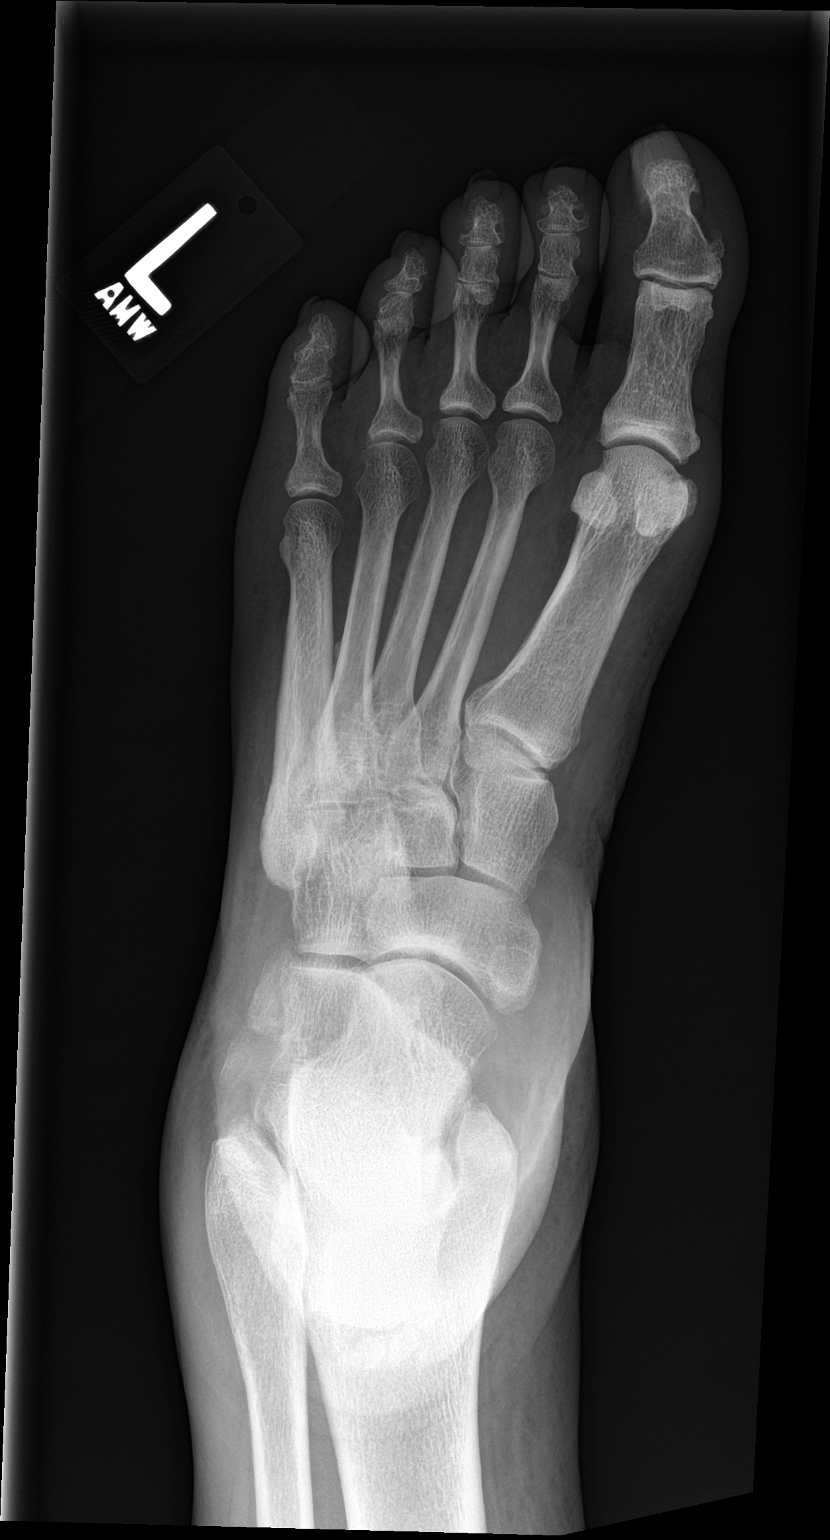

[foot obl]
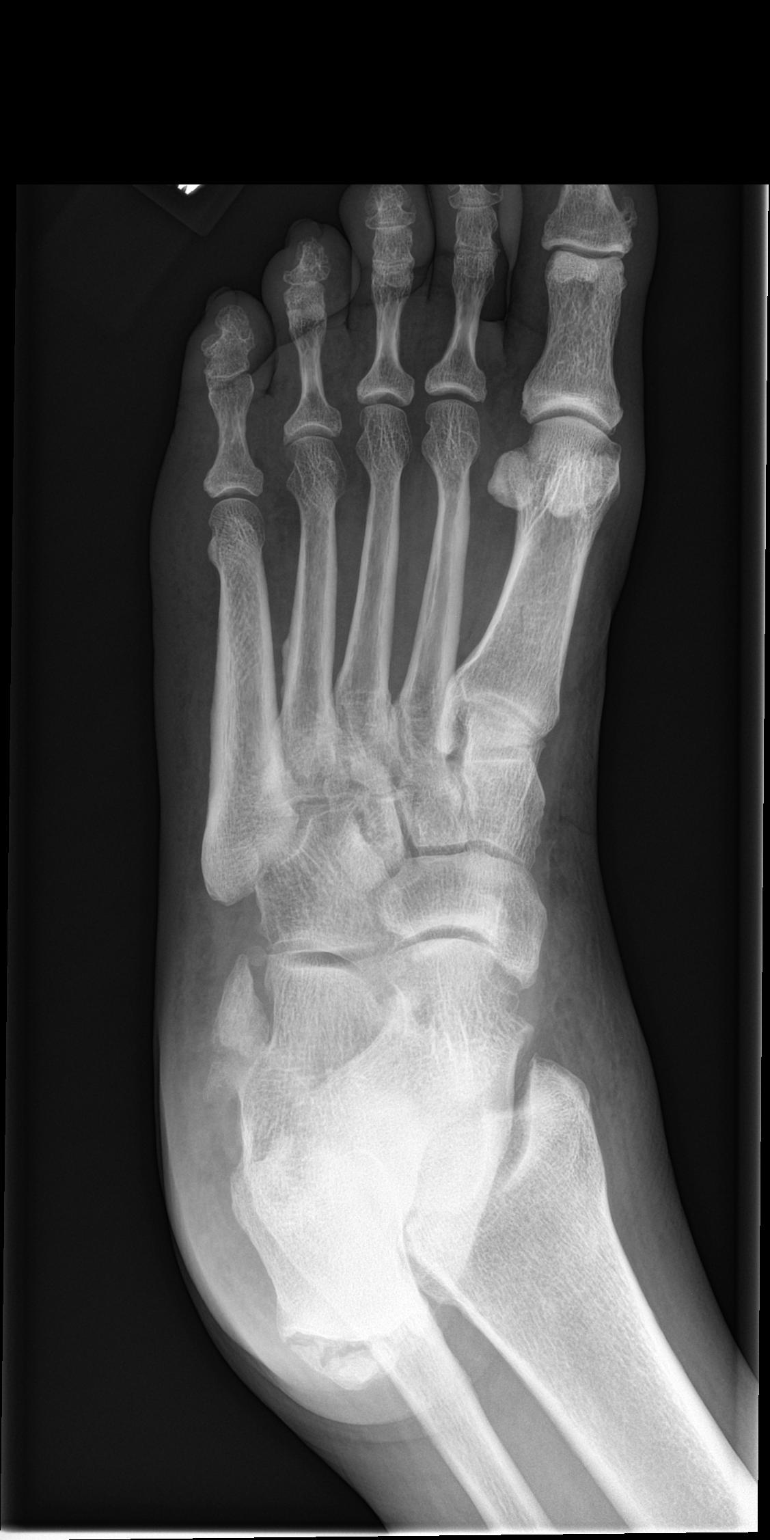

[foot lat]
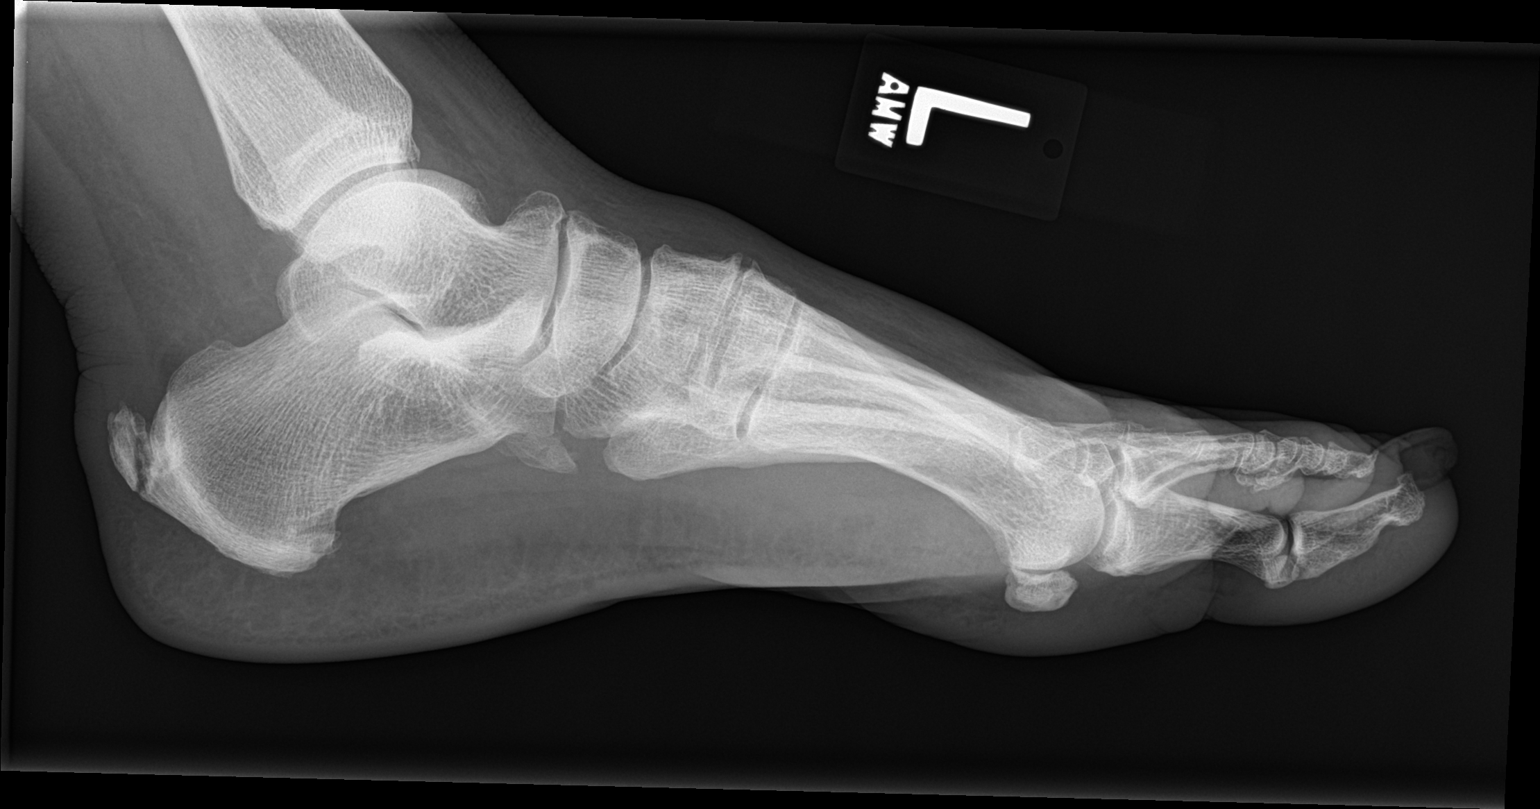

[3 of 3 positions shown; findings below may reference images not displayed]

FINDINGS: No abnormality in the forefoot. There is some chronic thickening of
the cortex of the fifth metatarsal that could go along with chronic
stress reaction. There are coarse calcifications in the lateral
tendons which are chronic. There is calcification in the distal
Achilles tendon which is chronic. There are midfoot degenerative
changes. Small plantar calcaneal spur.
IMPRESSION: No acute or traumatic finding. Chronic calcifications in the lateral
tendons and in the distal Achilles tendon. Midfoot degenerative
changes. Small plantar calcaneal spur.

## 2016-04-27 ENCOUNTER — Encounter: Payer: Managed Care, Other (non HMO) | Admitting: Pulmonary Disease

## 2016-05-02 ENCOUNTER — Telehealth: Payer: Self-pay | Admitting: Pulmonary Disease

## 2016-05-02 NOTE — Telephone Encounter (Signed)
APT. REMINDER CALL, LMTCB °

## 2016-05-03 ENCOUNTER — Ambulatory Visit (INDEPENDENT_AMBULATORY_CARE_PROVIDER_SITE_OTHER): Payer: Managed Care, Other (non HMO) | Admitting: Internal Medicine

## 2016-05-03 ENCOUNTER — Encounter: Payer: Self-pay | Admitting: Internal Medicine

## 2016-05-03 VITALS — BP 158/86 | HR 82 | Temp 98.0°F | Ht 71.0 in | Wt 250.8 lb

## 2016-05-03 DIAGNOSIS — Z79899 Other long term (current) drug therapy: Secondary | ICD-10-CM

## 2016-05-03 DIAGNOSIS — R7303 Prediabetes: Secondary | ICD-10-CM | POA: Diagnosis not present

## 2016-05-03 DIAGNOSIS — G2581 Restless legs syndrome: Secondary | ICD-10-CM | POA: Diagnosis not present

## 2016-05-03 DIAGNOSIS — D509 Iron deficiency anemia, unspecified: Secondary | ICD-10-CM

## 2016-05-03 DIAGNOSIS — I10 Essential (primary) hypertension: Secondary | ICD-10-CM

## 2016-05-03 DIAGNOSIS — R202 Paresthesia of skin: Secondary | ICD-10-CM

## 2016-05-03 DIAGNOSIS — D508 Other iron deficiency anemias: Secondary | ICD-10-CM

## 2016-05-03 LAB — POCT GLYCOSYLATED HEMOGLOBIN (HGB A1C): HEMOGLOBIN A1C: 5.4

## 2016-05-03 LAB — GLUCOSE, CAPILLARY: GLUCOSE-CAPILLARY: 121 mg/dL — AB (ref 65–99)

## 2016-05-03 MED ORDER — FERROUS SULFATE 325 (65 FE) MG PO TABS: 325.0000 mg | ORAL_TABLET | Freq: Every day | ORAL | 3 refills | Status: DC

## 2016-05-03 MED ORDER — HYDROCHLOROTHIAZIDE 25 MG PO TABS: 25.0000 mg | ORAL_TABLET | Freq: Every day | ORAL | 3 refills | Status: DC

## 2016-05-03 MED ORDER — METOPROLOL SUCCINATE ER 50 MG PO TB24: 50.0000 mg | ORAL_TABLET | Freq: Every day | ORAL | 3 refills | Status: DC

## 2016-05-03 NOTE — Assessment & Plan Note (Signed)
Vitals:   05/03/16 1404  BP: (!) 176/87  Pulse: 91  Temp: 98 F (36.7 C)   BP remains high likely from not taking her meds for last 3 days. On hctz 25mg  daily + metoprolol 50mg  24hr tablet.Will cont same regimen for now: recheck in 1 month.

## 2016-05-03 NOTE — Patient Instructions (Signed)
You likely have restless leg syndrome from low iron.  We will check your iron again today. Please start taking Iron pills daily.   Take your blood pressure pills.  F/up in 6 weeks with Dr. Randell Patient.

## 2016-05-03 NOTE — Assessment & Plan Note (Signed)
Has some painful burning sensation when she goes to bed at night, moving helps. Has iron def anemia. This is likely restless leg syndrome caused by iron deficiency. Other etiologies include paresthesia from neuropathy.   - check ferritin. Work on replacing iron.  - check hgba1c (had pre-diabetes before) as she could also have polyneuropathy from this.  -further management if she does not improve with iron supplementation.

## 2016-05-03 NOTE — Assessment & Plan Note (Signed)
Last hgba1c 6.0. Will recheck today to make sure she is not diabetic which could cause some paresthesias if she has polyneuropathy.

## 2016-05-03 NOTE — Assessment & Plan Note (Signed)
Has hx of iron def anemia, was told to take iron supplements but she has not started them. Will recheck CBC and ferritin today. I suspect it will be still low.  - PO iron supplement.

## 2016-05-03 NOTE — Progress Notes (Signed)
   CC: burning on legs and HTN f/up   HPI:  [name] is a 56 y.o. with PMH listed below is here for f/up of HTN and burning on both legs.  Past Medical History:  Diagnosis Date  . Allergic rhinitis   . Hypertension   . Obesity   . OSA (obstructive sleep apnea) 2006   Dx 7/06 with RDI 145.6 and desat to 62%. CPAP at 11 reduced RDI to 7.1. Repeat testing Aug 2006 showed RDI 0.7 on CPAP of 0.7  . Osteoarthritis    Of knees B. Uses hydrocodone sparingly.   . Osteoarthritis     Has burning sensation on both legs, started 2 months ago suddenly. Feels it worse at night, feels on leg, especially when she lies down and stays still, moving her leg and rubbing makes it better. Has not been taking iron supplement like she was prescribed previously. Last ferritin 7, hgb 9.0. cscope was ordered last time. No GI bleed, hematuria.   Has not taken any meds today. On hctz 25mg  daily + metoprolol 50mg  24hr tablet. Ran out of meds, has not been taking it for last 3 days.    Review of Systems:   Review of Systems  Constitutional: Negative for chills and fever.  Eyes: Negative for blurred vision and double vision.  Cardiovascular: Negative for chest pain, palpitations and orthopnea.  Gastrointestinal: Negative for heartburn, nausea and vomiting.  Genitourinary: Negative for dysuria.  Neurological: Negative for dizziness and tingling.  Psychiatric/Behavioral: Negative for depression and suicidal ideas.     Physical Exam:  Vitals:   05/03/16 1404  BP: (!) 176/87  Pulse: 91  Temp: 98 F (36.7 C)  TempSrc: Oral  SpO2: 100%  Weight: 250 lb 12.8 oz (113.8 kg)  Height: 5\' 11"  (1.803 m)   Physical Exam  Constitutional: She is oriented to person, place, and time. She appears well-developed and well-nourished. No distress.  HENT:  Head: Normocephalic and atraumatic.  Eyes: Conjunctivae are normal. Right eye exhibits no discharge. Left eye exhibits no discharge.  Cardiovascular: Normal  rate and regular rhythm.  Exam reveals no gallop and no friction rub.   No murmur heard. Respiratory: Effort normal and breath sounds normal. No respiratory distress.  Musculoskeletal: Normal range of motion. She exhibits no edema or deformity.  Neurological: She is alert and oriented to person, place, and time. No cranial nerve deficit.  Sensation intact on both legs. Good strength. No gross abnormalities.   Skin: She is not diaphoretic.    Assessment & Plan:   See Encounters Tab for problem based charting.  Patient discussed with Dr. Lynnae January

## 2016-05-04 LAB — CBC
Hematocrit: 36.3 % (ref 34.0–46.6)
Hemoglobin: 11.2 g/dL (ref 11.1–15.9)
MCH: 24.9 pg — AB (ref 26.6–33.0)
MCHC: 30.9 g/dL — ABNORMAL LOW (ref 31.5–35.7)
MCV: 81 fL (ref 79–97)
PLATELETS: 342 10*3/uL (ref 150–379)
RBC: 4.49 x10E6/uL (ref 3.77–5.28)
RDW: 20 % — ABNORMAL HIGH (ref 12.3–15.4)
WBC: 3.8 10*3/uL (ref 3.4–10.8)

## 2016-05-04 LAB — FERRITIN: FERRITIN: 15 ng/mL (ref 15–150)

## 2016-05-05 NOTE — Progress Notes (Signed)
Internal Medicine Clinic Attending  Case discussed with Dr. Ahmed at the time of the visit.  We reviewed the resident's history and exam and pertinent patient test results.  I agree with the assessment, diagnosis, and plan of care documented in the resident's note. 

## 2016-06-14 ENCOUNTER — Ambulatory Visit: Payer: Managed Care, Other (non HMO)

## 2016-06-20 ENCOUNTER — Ambulatory Visit: Payer: Managed Care, Other (non HMO)

## 2016-06-22 ENCOUNTER — Telehealth: Payer: Self-pay | Admitting: Pulmonary Disease

## 2016-06-22 NOTE — Telephone Encounter (Signed)
APT. REMINDER CALL, LMTCB °

## 2016-06-23 ENCOUNTER — Ambulatory Visit: Payer: Managed Care, Other (non HMO)

## 2016-08-10 ENCOUNTER — Encounter: Payer: Managed Care, Other (non HMO) | Admitting: Pulmonary Disease

## 2016-08-10 ENCOUNTER — Encounter: Payer: Self-pay | Admitting: Pulmonary Disease

## 2016-08-14 ENCOUNTER — Encounter: Payer: Self-pay | Admitting: *Deleted

## 2017-03-12 ENCOUNTER — Encounter: Payer: Self-pay | Admitting: Internal Medicine

## 2017-03-12 ENCOUNTER — Encounter: Payer: Managed Care, Other (non HMO) | Admitting: Internal Medicine

## 2017-05-07 ENCOUNTER — Encounter: Payer: Self-pay | Admitting: Internal Medicine

## 2017-05-22 ENCOUNTER — Encounter: Payer: Self-pay | Admitting: Internal Medicine

## 2017-05-23 ENCOUNTER — Other Ambulatory Visit: Payer: Self-pay

## 2017-05-23 ENCOUNTER — Ambulatory Visit: Payer: Managed Care, Other (non HMO) | Admitting: Internal Medicine

## 2017-05-23 VITALS — BP 164/85 | HR 86 | Temp 98.5°F | Ht 71.0 in | Wt 281.9 lb

## 2017-05-23 DIAGNOSIS — D509 Iron deficiency anemia, unspecified: Secondary | ICD-10-CM

## 2017-05-23 DIAGNOSIS — Z791 Long term (current) use of non-steroidal anti-inflammatories (NSAID): Secondary | ICD-10-CM | POA: Diagnosis not present

## 2017-05-23 DIAGNOSIS — I10 Essential (primary) hypertension: Secondary | ICD-10-CM | POA: Diagnosis not present

## 2017-05-23 DIAGNOSIS — M67912 Unspecified disorder of synovium and tendon, left shoulder: Secondary | ICD-10-CM

## 2017-05-23 DIAGNOSIS — Z79899 Other long term (current) drug therapy: Secondary | ICD-10-CM

## 2017-05-23 DIAGNOSIS — D508 Other iron deficiency anemias: Secondary | ICD-10-CM

## 2017-05-23 MED ORDER — FERROUS SULFATE 325 (65 FE) MG PO TABS: 325.0000 mg | ORAL_TABLET | Freq: Every day | ORAL | 3 refills | Status: DC

## 2017-05-23 MED ORDER — METOPROLOL SUCCINATE ER 50 MG PO TB24
50.0000 mg | ORAL_TABLET | Freq: Every day | ORAL | 3 refills | Status: DC
Start: 2017-05-23 — End: 2018-05-09

## 2017-05-23 MED ORDER — LISINOPRIL-HYDROCHLOROTHIAZIDE 20-25 MG PO TABS: 1.0000 | ORAL_TABLET | Freq: Every day | ORAL | 3 refills | Status: DC

## 2017-05-23 MED ORDER — HYDROCHLOROTHIAZIDE 50 MG PO TABS
50.0000 mg | ORAL_TABLET | Freq: Every day | ORAL | 3 refills | Status: DC
Start: 2017-05-23 — End: 2017-05-23

## 2017-05-23 MED ORDER — NAPROXEN 500 MG PO TABS: 500.0000 mg | ORAL_TABLET | Freq: Two times a day (BID) | ORAL | 0 refills | Status: DC

## 2017-05-23 NOTE — Progress Notes (Signed)
   CC: Left shoulder pain for 2 weeks.  HPI:  Emily Schmitt is a 57 y.o. past medical history as listed below came to the clinic with complaint of worsening left shoulder pain for the past 2 weeks.  Pain is worse with any movement around the shoulder, mostly when she is combing her hairs and reaching on her back.  Occasionally pain travel down the up to her elbow.  She denies any tingling numbness or focal weakness.  She denies any recent trauma or hearing any popping sound.  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  Her pain is worse at night and interferes with her sleep.  She occasionally noticed some heaviness in her biceps area.  She has tried over-the-counter Aleve and some warm compresses with minimum relief.  She has no other complaints.  Past Medical History:  Diagnosis Date  . Allergic rhinitis   . Hypertension   . Obesity   . OSA (obstructive sleep apnea) 2006   Dx 7/06 with RDI 145.6 and desat to 62%. CPAP at 9 reduced RDI to 7.1. Repeat testing Aug 2006 showed RDI 0.7 on CPAP of 0.7  . Osteoarthritis    Of knees B. Uses hydrocodone sparingly.   . Osteoarthritis    Review of Systems: Negative except mentioned in HPI.  Physical Exam:  Vitals:   05/23/17 1604  BP: (!) 164/85  Pulse: 86  Temp: 98.5 F (36.9 C)  TempSrc: Oral  SpO2: 99%  Weight: 281 lb 14.4 oz (127.9 kg)  Height: 5\' 11"  (1.803 m)   Vitals:   05/23/17 1604  BP: (!) 164/85  Pulse: 86  Temp: 98.5 F (36.9 C)  TempSrc: Oral  SpO2: 99%  Weight: 281 lb 14.4 oz (127.9 kg)  Height: 5\' 11"  (1.803 m)   General: Vital signs reviewed.  Patient is well-developed and well-nourished, in no acute distress and cooperative with exam.  Head: Normocephalic and atraumatic. Neck: Supple, trachea midline, normal ROM, no JVD, masses, thyromegaly, or carotid bruit present.  Cardiovascular: RRR, S1 normal, S2 normal, no murmurs, gallops, or rubs. Pulmonary/Chest: Clear  to auscultation bilaterally, no wheezes, rales, or rhonchi. Abdominal: Soft, non-tender, non-distended, BS +, no masses, organomegaly, or guarding present.  Musculoskeletal: Tenderness along anterior and superior left shoulder, mildly restricted ROM due to pain, more pronounced with internal and external rotation and flexion of shoulder.  Strength and sensations intact and symmetrical. Extremities: No lower extremity edema bilaterally,  pulses symmetric and intact bilaterally. No cyanosis or clubbing. Skin: Warm, dry and intact. No rashes or erythema. Psychiatric: Normal mood and affect. speech and behavior is normal. Cognition and memory are normal.  Assessment & Plan:   See Encounters Tab for problem based charting.  Patient seen with Dr. Evette Doffing.

## 2017-05-23 NOTE — Assessment & Plan Note (Signed)
She has an history of iron deficiency anemia and continue to take her iron supplement. She denies any exertional dyspnea.  -We will check CBC and ferritin level during next follow-up visit along with BMP.

## 2017-05-23 NOTE — Assessment & Plan Note (Signed)
Her symptoms and exams were more consistent with rotator cuff tendinopathy. POC ultrasound was performed which shows some inflammation of subacromial bursa.  Supraspinatus tendon more prominent.  She was offered conservative management with NSAID vs steroid injection, she decided to go with conservative management at this time and will let us know if that fails.  She was given a prescription of naproxen 500 mg twice daily, she should take it every day for 3-4 days and then as needed. She was asked to follow-up in clinic if her symptoms get worse or fail to resolve within next 2 weeks.

## 2017-05-23 NOTE — Patient Instructions (Addendum)
Thank you for visiting clinic today. It looks like you are having some inflammation of your shoulder tendons. I am giving you a prescription strength Aleve if it does not cover under your insurance you can take over-the-counter Aleve and take 2 pills twice daily for 3-4 days and then as needed. As we discussed your blood pressure was little elevated today, it was elevated during her previous office visit last year, I am making some changes to your blood pressure medicine, you can stop taking HCTZ , I am giving you another medicine called Zestoretic which is a combination of lisinopril and HCTZ, you will take 1 tablet daily.  The most common side effect is cough and most rare side effect is swelling of your lips and tongue, if you experience any of those symptoms please contact the clinic and stop taking that medicine. I am also providing you more refills for your Toprol. Please follow-up with your PCP within a month for your blood pressure check. You can follow-up in clinic if your symptoms get worse or fail to resolved as we can give you an injection in your shoulder.

## 2017-05-23 NOTE — Assessment & Plan Note (Signed)
BP Readings from Last 3 Encounters:  05/23/17 (!) 164/85  05/03/16 (!) 158/86  05/13/15 127/68   Her blood pressure was elevated today. She does not check her blood pressure at home.  She denies any headache or change in her vision.  Denies any chest pain, palpitations or exertional dyspnea. She denies any dizziness.  She is compliant with her metoprolol and HCTZ 25 mg daily.  -Stop HCTZ. -Started her on Zestoretic 20-25 milligrams daily. -Continue metoprolol 50 mg daily-new refills were provided. -Follow-up within 1 month for blood pressure check. -She will need BMP during that follow-up visit.

## 2017-05-24 ENCOUNTER — Telehealth: Payer: Self-pay | Admitting: *Deleted

## 2017-05-24 NOTE — Telephone Encounter (Signed)
Pt called to clarify her new instructions for htn meds, went over note/ instructions and pt repeated back twice, before hanging up I repeated the med instructions

## 2017-05-24 NOTE — Progress Notes (Signed)
Internal Medicine Clinic Attending  I saw and evaluated the patient.  I personally confirmed the key portions of the history and exam documented by Dr. Amin and I reviewed pertinent patient test results.  The assessment, diagnosis, and plan were formulated together and I agree with the documentation in the resident's note. 

## 2017-06-21 ENCOUNTER — Ambulatory Visit: Payer: Managed Care, Other (non HMO) | Admitting: Internal Medicine

## 2017-06-21 ENCOUNTER — Other Ambulatory Visit: Payer: Self-pay

## 2017-06-21 ENCOUNTER — Encounter: Payer: Self-pay | Admitting: Internal Medicine

## 2017-06-21 VITALS — BP 135/64 | HR 92 | Temp 98.2°F | Ht 71.0 in | Wt 283.7 lb

## 2017-06-21 DIAGNOSIS — M25512 Pain in left shoulder: Secondary | ICD-10-CM | POA: Diagnosis not present

## 2017-06-21 DIAGNOSIS — Z8742 Personal history of other diseases of the female genital tract: Secondary | ICD-10-CM | POA: Diagnosis not present

## 2017-06-21 DIAGNOSIS — D649 Anemia, unspecified: Secondary | ICD-10-CM | POA: Diagnosis not present

## 2017-06-21 DIAGNOSIS — Z0000 Encounter for general adult medical examination without abnormal findings: Secondary | ICD-10-CM

## 2017-06-21 DIAGNOSIS — G4733 Obstructive sleep apnea (adult) (pediatric): Secondary | ICD-10-CM | POA: Diagnosis not present

## 2017-06-21 DIAGNOSIS — Z791 Long term (current) use of non-steroidal anti-inflammatories (NSAID): Secondary | ICD-10-CM

## 2017-06-21 DIAGNOSIS — M25522 Pain in left elbow: Secondary | ICD-10-CM

## 2017-06-21 DIAGNOSIS — Z79899 Other long term (current) drug therapy: Secondary | ICD-10-CM

## 2017-06-21 DIAGNOSIS — Z1239 Encounter for other screening for malignant neoplasm of breast: Secondary | ICD-10-CM

## 2017-06-21 DIAGNOSIS — R7303 Prediabetes: Secondary | ICD-10-CM | POA: Diagnosis not present

## 2017-06-21 DIAGNOSIS — Z1211 Encounter for screening for malignant neoplasm of colon: Secondary | ICD-10-CM

## 2017-06-21 DIAGNOSIS — I10 Essential (primary) hypertension: Secondary | ICD-10-CM | POA: Diagnosis not present

## 2017-06-21 DIAGNOSIS — Z862 Personal history of diseases of the blood and blood-forming organs and certain disorders involving the immune mechanism: Secondary | ICD-10-CM

## 2017-06-21 DIAGNOSIS — G2581 Restless legs syndrome: Secondary | ICD-10-CM

## 2017-06-21 NOTE — Assessment & Plan Note (Signed)
Assessment She states she used to use a CPAP machine however has not used it in a few years.  She denies PND or morning headaches, however she does endorse fatigue and daytime somnolence.  She does snore occasionally at night.  Last sleep study was 2006.   Plan - Referral to sleep medicine placed for renewal of CPAP settings

## 2017-06-21 NOTE — Assessment & Plan Note (Signed)
Assessment BP 135/64 today.  She reports compliance with lisinopril-HCTZ and metoprolol.  She denies headaches, chest pain, or shortness of breath.  She does not check her blood pressure at home.  Plan - Continue lisinopril-HCTZ 20-25 mg daily and metoprolol 50 mg daily - Check BMP today - Follow-up in 6 weeks

## 2017-06-21 NOTE — Progress Notes (Signed)
   CC: management of BP  HPI:  Emily Schmitt is a 57 y.o. female with PMH of HTN, OSA, RLS, pre-diabetes, and anemia who presents for management of BP.  She was seen in clinic on 3/27 with left shoulder pain, thought to be due to tendinopathy.  She was offered conservative treatment with NSAIDs versus steroid injection.  She preferred to take the NSAIDs.  She was also started on lisinopril-HCTZ at that visit.  She returns today and reports that her shoulder pain has improved significantly with the NSAIDs.  She does endorse occasional left shoulder pain in the same location as on her prior visit as well as elbow pain, that is relieved by naproxen.  Blood pressure is 135/64.  She reports compliance with her medications.  She denies headaches, chest pain, shortness of breath, or dysuria.  She does have sleep apnea. She states that she previously used a CPAP machine, however she has not used the machine in a few years.  She denies PND, or morning headaches.  She does endorse fatigue and daytime somnolence.  She states she does snore some nights.  Last sleep study was in 2006.  Please see the assessment and plan below for the status of the patient's chronic medical problems.  Past Medical History:  Diagnosis Date  . Allergic rhinitis   . Hypertension   . Obesity   . OSA (obstructive sleep apnea) 2006   Dx 7/06 with RDI 145.6 and desat to 62%. CPAP at 9 reduced RDI to 7.1. Repeat testing Aug 2006 showed RDI 0.7 on CPAP of 0.7  . Osteoarthritis    Of knees B. Uses hydrocodone sparingly.   . Osteoarthritis    Review of Systems:   GEN: Positive for fatigue and daytime somnolence NEURO: Negative for headaches or morning headaches CV: Negative for chest pain PULM: Negative for cough or SOB EXT: Negative for LE swelling. Positive for mild left shoulder and elbow pain  Physical Exam:  Vitals:   06/21/17 1557  BP: 135/64  Pulse: 92  Temp: 98.2 F (36.8 C)  TempSrc: Oral  SpO2: 100%    Weight: 283 lb 11.2 oz (128.7 kg)  Height: 5\' 11"  (1.803 m)   GEN: Sitting in chair in NAD CV: NR & RR, no m/r/g PULM: CTAB, no wheezes or rales MSK: No LE edema, mild tenderness to palpation over anterior and posterior shoulder, as well as over left elbow. No swelling or erythema evident.  Assessment & Plan:   See Encounters Tab for problem based charting.  Patient discussed with Dr. Beryle Beams

## 2017-06-21 NOTE — Assessment & Plan Note (Signed)
Colon cancer screening Patient has never had a colonoscopy - Referral to GI for screening colonoscopy ordered  Breast cancer screening Last mammogram in 2006 was normal. - Screening mammogram ordered  TDAP vaccination Patient declines TDAP vaccination today. States she will do this at her next visit.

## 2017-06-21 NOTE — Patient Instructions (Addendum)
FOLLOW-UP INSTRUCTIONS When: 6 weeks For: BP management What to bring: medications   Emily Schmitt,  It was a pleasure to meet you today.  Your blood pressure looks good today. Please continue to take your lisinopril-HCTZ and metoprolol.  We are checking some bloodwork today - I will let you know the results when we get them back.  We are placing an order for a mammogram, a colonoscopy, and another sleep study.  Please see me back in about 6 weeks.

## 2017-06-21 NOTE — Assessment & Plan Note (Addendum)
Assessment Hx of iron deficiency anemia, thought to be secondary to menorrhagia from uterine fibroids. She reports her cycles have become more regular since then  Reports compliance with iron supplement. Denies symptoms. Last Hb in 2018 was 11.2 with ferritin 15. Will recheck today.  Plan - Check CBC and ferritin today - Continue ferrous sulfate 325mg  daily  ADDENDUM: Hb 12.9, ferritin normal at 110. Called patient to inform her of results. Advised her to stop taking oral iron and that we will continue to monitor while off iron.

## 2017-06-22 LAB — CBC
HEMATOCRIT: 39.5 % (ref 34.0–46.6)
HEMOGLOBIN: 12.9 g/dL (ref 11.1–15.9)
MCH: 30.1 pg (ref 26.6–33.0)
MCHC: 32.7 g/dL (ref 31.5–35.7)
MCV: 92 fL (ref 79–97)
Platelets: 238 10*3/uL (ref 150–379)
RBC: 4.29 x10E6/uL (ref 3.77–5.28)
RDW: 14.6 % (ref 12.3–15.4)
WBC: 4.9 10*3/uL (ref 3.4–10.8)

## 2017-06-22 LAB — BMP8+ANION GAP
Anion Gap: 16 mmol/L (ref 10.0–18.0)
BUN/Creatinine Ratio: 28 — ABNORMAL HIGH (ref 9–23)
BUN: 21 mg/dL (ref 6–24)
CHLORIDE: 98 mmol/L (ref 96–106)
CO2: 24 mmol/L (ref 20–29)
CREATININE: 0.74 mg/dL (ref 0.57–1.00)
Calcium: 9.5 mg/dL (ref 8.7–10.2)
GFR calc Af Amer: 103 mL/min/{1.73_m2} (ref >59)
GFR calc non Af Amer: 89 mL/min/{1.73_m2} (ref >59)
GLUCOSE: 159 mg/dL — AB (ref 65–99)
Potassium: 3.4 mmol/L — ABNORMAL LOW (ref 3.5–5.2)
Sodium: 138 mmol/L (ref 134–144)

## 2017-06-22 LAB — FERRITIN: Ferritin: 110 ng/mL (ref 15–150)

## 2017-06-22 NOTE — Addendum Note (Signed)
Addended by: Oretha Caprice on: 06/22/2017 09:44 AM   Modules accepted: Orders

## 2017-06-24 NOTE — Progress Notes (Signed)
Medicine attending: Medical history, presenting problems, physical findings, and medications, reviewed with resident physician Dr Jennifer Huang on the day of the patient visit and I concur with her evaluation and management plan. 

## 2017-06-25 NOTE — Addendum Note (Signed)
Addended by: Oretha Caprice on: 06/25/2017 12:54 PM   Modules accepted: Orders

## 2017-07-03 ENCOUNTER — Encounter: Payer: Self-pay | Admitting: *Deleted

## 2017-07-24 ENCOUNTER — Encounter: Payer: Self-pay | Admitting: Internal Medicine

## 2017-08-02 ENCOUNTER — Encounter: Payer: Managed Care, Other (non HMO) | Admitting: Internal Medicine

## 2017-08-06 NOTE — Addendum Note (Signed)
Addended by: Hulan Fray on: 08/06/2017 06:56 PM   Modules accepted: Orders

## 2017-08-09 ENCOUNTER — Encounter (INDEPENDENT_AMBULATORY_CARE_PROVIDER_SITE_OTHER): Payer: Self-pay

## 2017-08-09 ENCOUNTER — Encounter: Payer: Self-pay | Admitting: Internal Medicine

## 2017-08-09 ENCOUNTER — Ambulatory Visit: Payer: Managed Care, Other (non HMO) | Admitting: Internal Medicine

## 2017-08-09 ENCOUNTER — Other Ambulatory Visit: Payer: Self-pay

## 2017-08-09 VITALS — BP 121/68 | HR 80 | Temp 98.1°F | Ht 71.0 in | Wt 290.8 lb

## 2017-08-09 DIAGNOSIS — I10 Essential (primary) hypertension: Secondary | ICD-10-CM | POA: Diagnosis not present

## 2017-08-09 DIAGNOSIS — Z0000 Encounter for general adult medical examination without abnormal findings: Secondary | ICD-10-CM | POA: Diagnosis not present

## 2017-08-09 DIAGNOSIS — G4733 Obstructive sleep apnea (adult) (pediatric): Secondary | ICD-10-CM

## 2017-08-09 NOTE — Progress Notes (Signed)
   CC: management of HTN  HPI:  Emily Schmitt is a 57 y.o. female with PMH of anemia, OSA, and HTN who presents for follow up of blood pressure.  Please see the assessment and plan below for the status of the patient's chronic medical problems.  Past Medical History:  Diagnosis Date  . Allergic rhinitis   . Hypertension   . Obesity   . OSA (obstructive sleep apnea) 2006   Dx 7/06 with RDI 145.6 and desat to 62%. CPAP at 11 reduced RDI to 7.1. Repeat testing Aug 2006 showed RDI 0.7 on CPAP of 0.7  . Osteoarthritis    Of knees B. Uses hydrocodone sparingly.   . Osteoarthritis    Review of Systems:   GEN: Negative for fevers CV: Negative for chest pain  PULM: Negative for SOB EXT: Negative for LE swelling  Physical Exam:  Vitals:   08/09/17 1431  BP: 121/68  Pulse: 80  Temp: 98.1 F (36.7 C)  TempSrc: Oral  SpO2: 100%  Weight: 290 lb 12.8 oz (131.9 kg)  Height: 5\' 11"  (1.803 m)   GEN: Sitting comfortably in chair in NAD CV: NR & RR, no m/r/g PULM: CTAB, no wheezes or rales EXT: 1+ BLE edema of ankles  Assessment & Plan:   See Encounters Tab for problem based charting.  Patient discussed with Dr. Rebeca Alert

## 2017-08-09 NOTE — Patient Instructions (Addendum)
FOLLOW-UP INSTRUCTIONS When: 3 months For: BP management What to bring: medications   Emily Schmitt,  It was nice to see you again today.  For your blood pressure, please take the following medicines: - Continue metoprolol 50mg  once a day (the white pill) - Continue lisinopril-HCTZ 20-25mg  once a day (the pink pill that has two medicines in it) - STOP HCTZ 50mg  once a day (another pink pill)  I have re-ordered the sleep study, colonoscopy, and mammogram.  If you have any questions or concerns, call our clinic at [phone] or after hours call [phone] and ask for the internal medicine resident on call.

## 2017-08-09 NOTE — Addendum Note (Signed)
Addended by: Oretha Caprice on: 08/09/2017 03:23 PM   Modules accepted: Orders

## 2017-08-09 NOTE — Assessment & Plan Note (Signed)
Assessment BP 121/68 today. She is prescribed metoprolol 50mg  daily and lisinopril-HCTZ 20-25mg  daily, with which she reports adherence. However, she also states she has been taking HCTZ 50mg  daily because she was unaware that she was supposed to stop this medication at her last visit. She denies chest pain, SOB, or lightheadedness.  Plan - Stop HCTZ 50mg  daily - Continue metoprolol 50mg  daily and lisinopril-HCTZ 20-25mg  daily - F/u in 3 months

## 2017-08-09 NOTE — Assessment & Plan Note (Signed)
Colon cancer screening Referral to GI placed at last visit, however wrong number was in chart, so she was unable to be reached. - Re-ordered Referral to GI for screening colonoscopy  Breast cancer screening Wrong number in chart so unable to schedule mammogram ordered at last visit - Re-ordered screening mammogram  TDAP vaccination Patient declines today. States she will think about it for next visit.

## 2017-08-09 NOTE — Assessment & Plan Note (Signed)
Assessment Sleep study ordered at last appointment, however she states that they were unable to reach her because the wrong phone number was in the chart.  Plan - Re-ordered Referral to Sleep Medicine

## 2017-08-14 ENCOUNTER — Telehealth: Payer: Self-pay | Admitting: Internal Medicine

## 2017-08-14 NOTE — Telephone Encounter (Signed)
Patient missed call, please call back

## 2017-08-15 NOTE — Addendum Note (Signed)
Addended by: Oretha Caprice on: 08/15/2017 01:46 PM   Modules accepted: Orders

## 2017-08-18 NOTE — Progress Notes (Signed)
Internal Medicine Clinic Attending  Case discussed with Dr. Huang  at the time of the visit.  We reviewed the resident's history and exam and pertinent patient test results.  I agree with the assessment, diagnosis, and plan of care documented in the resident's note.  Alexander N Raines, MD   

## 2017-08-19 ENCOUNTER — Encounter: Payer: Self-pay | Admitting: *Deleted

## 2017-09-19 ENCOUNTER — Encounter: Payer: Self-pay | Admitting: Gastroenterology

## 2017-09-19 ENCOUNTER — Institutional Professional Consult (permissible substitution): Payer: Managed Care, Other (non HMO) | Admitting: Pulmonary Disease

## 2017-09-25 ENCOUNTER — Other Ambulatory Visit: Payer: Self-pay | Admitting: Internal Medicine

## 2017-09-25 DIAGNOSIS — Z1231 Encounter for screening mammogram for malignant neoplasm of breast: Secondary | ICD-10-CM

## 2017-10-01 ENCOUNTER — Ambulatory Visit (AMBULATORY_SURGERY_CENTER): Payer: Managed Care, Other (non HMO) | Admitting: *Deleted

## 2017-10-01 VITALS — Ht 71.0 in | Wt 289.9 lb

## 2017-10-01 DIAGNOSIS — Z1211 Encounter for screening for malignant neoplasm of colon: Secondary | ICD-10-CM

## 2017-10-01 MED ORDER — SUPREP BOWEL PREP KIT 17.5-3.13-1.6 GM/177ML PO SOLN: 1.0000 | Freq: Once | ORAL | 0 refills | Status: AC

## 2017-10-01 NOTE — Progress Notes (Signed)
No egg or soy allergy known to patient  No issues with past sedation with any surgeries  or procedures, no intubation problems  No diet pills per patient No home 02 use per patient  No blood thinners per patient  Pt denies issues with constipation  No A fib or A flutter  EMMI video offered, patient does not have email

## 2017-10-04 ENCOUNTER — Encounter: Payer: Self-pay | Admitting: Pulmonary Disease

## 2017-10-04 ENCOUNTER — Ambulatory Visit: Payer: Managed Care, Other (non HMO) | Admitting: Pulmonary Disease

## 2017-10-04 VITALS — BP 156/88 | HR 82 | Ht 71.5 in | Wt 294.0 lb

## 2017-10-04 DIAGNOSIS — G4733 Obstructive sleep apnea (adult) (pediatric): Secondary | ICD-10-CM

## 2017-10-04 NOTE — Patient Instructions (Signed)
History of obstructive sleep apnea-stopped using the machine after about a year Last machine was in 2006  Recent weight gain  Nonrestorative sleep, snoring, fatigue   We will order split-night study  Encouraged to exercise and work on weight loss  I will see you back in the office in 6 to 8 weeks

## 2017-10-04 NOTE — Progress Notes (Signed)
Emily Schmitt    [id number]    1960/05/13  Primary Care Physician:Masoudi, Dorthula Rue, MD  Referring Physician: Colbert Ewing, MD No address on file  Chief complaint:   History of obstructive sleep apnea  HPI:  Patient with a history of obstructive sleep apnea diagnosed in 2006, she used the machine for about a year She stated she just got away from using it Does not recollect having any specific problems with  She has been having persistent symptoms of snoring, nonrestorative sleep, fatigue, daytime sleepiness  Usually goes to bed about 9-10PM, wakes up a few times during the night Usually gets up about 3-4 AM to get to work  Occupation: No pertinent occupational history Exposures: No exposure to mold Smoking history: Non-smoker Travel history: No recent significant travel history Relevant family history: Son has severe snoring and is scheduled for study  Outpatient Encounter Medications as of 10/04/2017  Medication Sig  . lisinopril-hydrochlorothiazide (PRINZIDE,ZESTORETIC) 20-25 MG tablet Take 1 tablet by mouth daily.  . metoprolol succinate (TOPROL XL) 50 MG 24 hr tablet Take 1 tablet (50 mg total) by mouth daily. Take with or immediately following a meal.   No facility-administered encounter medications on file as of 10/04/2017.     Allergies as of 10/04/2017  . (No Known Allergies)    Past Medical History:  Diagnosis Date  . Allergic rhinitis   . Hypertension   . Obesity   . OSA (obstructive sleep apnea) 2006   Dx 7/06 with RDI 145.6 and desat to 62%. CPAP at 11 reduced RDI to 7.1. Repeat testing Aug 2006 showed RDI 0.7 on CPAP of 0.7  . Osteoarthritis    Of knees B. Uses hydrocodone sparingly.   . Osteoarthritis   . Sleep apnea     Past Surgical History:  Procedure Laterality Date  . TUBAL LIGATION      Family History  Problem Relation Age of Onset  . CVA Mother   . Diabetes Mother   . Stroke Mother   . Heart murmur Sister   .  Diabetes Sister   . Diabetes Brother   . Hypertension Sister   . Hypertension Brother   . Heart attack Neg Hx   . Colon cancer Neg Hx   . Colon polyps Neg Hx   . Esophageal cancer Neg Hx   . Pancreatic cancer Neg Hx   . Rectal cancer Neg Hx   . Stomach cancer Neg Hx     Social History   Socioeconomic History  . Marital status: Married    Spouse name: Not on file  . Number of children: Not on file  . Years of education: Not on file  . Highest education level: Not on file  Occupational History  . Occupation: Catering manager: [employer]  . Financial resource strain: Not on file  . Food insecurity:    Worry: Not on file    Inability: Not on file  . Transportation needs:    Medical: Not on file    Non-medical: Not on file  Tobacco Use  . Smoking status: Never Smoker  . Smokeless tobacco: Never Used  Substance and Sexual Activity  . Alcohol use: Yes    Alcohol/week: 0.0 standard drinks    Comment: socially  . Drug use: No  . Sexual activity: Yes  Lifestyle  . Physical activity:    Days per week: Not on file  Minutes per session: Not on file  . Stress: Not on file  Relationships  . Social connections:    Talks on phone: Not on file    Gets together: Not on file    Attends religious service: Not on file    Active member of club or organization: Not on file    Attends meetings of clubs or organizations: Not on file    Relationship status: Not on file  . Intimate partner violence:    Fear of current or ex partner: Not on file    Emotionally abused: Not on file    Physically abused: Not on file    Forced sexual activity: Not on file  Other Topics Concern  . Not on file  Social History Narrative  . Not on file    Review of systems: Review of Systems  Constitutional: Negative for fever and chills.  HENT: Negative.   Eyes: Negative for blurred vision.  Respiratory: as per HPI, denies any shortness of breath, no  chest pains or discomfort Cardiovascular: Negative for chest pain and palpitations.  Gastrointestinal: Negative for vomiting, diarrhea, blood per rectum. Genitourinary: Negative for dysuria, urgency, frequency and hematuria.  Musculoskeletal: Negative for myalgias, back pain and joint pain.  Skin: Negative for itching and rash.  Neurological: Negative for dizziness, tremors, focal weakness, seizures and loss of consciousness.  Endo/Heme/Allergies: Negative for environmental allergies.  Psychiatric/Behavioral: Negative for depression, suicidal ideas and hallucinations.  All other systems reviewed and are negative.  Physical Exam:  Vitals:   10/04/17 1459  BP: (!) 156/88  Pulse: 82  SpO2: 100%   Gen:      No acute distress HEENT:  EOMI, sclera anicteric oropharynx, Mallampati of 4 Neck:     No masses; no thyromegaly Lungs:    Clear to auscultation bilaterally; normal respiratory effort CV:         Regular rate and rhythm; no murmurs Abd:      + bowel sounds; soft, non-tender; no palpable masses, no distension Ext:    No edema; adequate peripheral perfusion Skin:      Warm and dry; no rash Neuro: alert and oriented x 3 Psych: normal mood and affect  Data Reviewed: Polysomnogram from 2006 reviewed Had severe sleep apnea, titrated CPAP of 14 at the time  Epworth Sleepiness Scale of 8  Assessment:   Severe obstructive sleep apnea  Nonrestorative sleep  Hypersomnia  Morbid obesity  Hypertension  Plan/Recommendations:  We will schedule the patient for an overnight split-night study  Encouraged to continue working on weight loss  Pathophysiology of sleep disordered breathing discussed  Options of treatment discussed   Sherrilyn Rist MD [facility] 10/04/2017, 3:25 PM  CC: Colbert Ewing, MD

## 2017-10-08 ENCOUNTER — Encounter: Payer: Self-pay | Admitting: Gastroenterology

## 2017-10-17 ENCOUNTER — Ambulatory Visit (AMBULATORY_SURGERY_CENTER): Payer: Managed Care, Other (non HMO) | Admitting: Gastroenterology

## 2017-10-17 ENCOUNTER — Encounter: Payer: Self-pay | Admitting: Gastroenterology

## 2017-10-17 VITALS — BP 143/88 | HR 71 | Temp 97.1°F | Resp 15 | Ht 71.5 in | Wt 294.0 lb

## 2017-10-17 DIAGNOSIS — D129 Benign neoplasm of anus and anal canal: Secondary | ICD-10-CM

## 2017-10-17 DIAGNOSIS — K621 Rectal polyp: Secondary | ICD-10-CM | POA: Diagnosis not present

## 2017-10-17 DIAGNOSIS — D125 Benign neoplasm of sigmoid colon: Secondary | ICD-10-CM | POA: Diagnosis not present

## 2017-10-17 DIAGNOSIS — D128 Benign neoplasm of rectum: Secondary | ICD-10-CM

## 2017-10-17 DIAGNOSIS — Z1211 Encounter for screening for malignant neoplasm of colon: Secondary | ICD-10-CM

## 2017-10-17 DIAGNOSIS — K5730 Diverticulosis of large intestine without perforation or abscess without bleeding: Secondary | ICD-10-CM | POA: Diagnosis not present

## 2017-10-17 HISTORY — PX: COLONOSCOPY: SHX174

## 2017-10-17 MED ORDER — SODIUM CHLORIDE 0.9 % IV SOLN: 500.0000 mL | Freq: Once | INTRAVENOUS | Status: AC

## 2017-10-17 NOTE — Op Note (Signed)
[facility] Patient Name: Emily Schmitt Procedure Date: 10/17/2017 1:21 PM MRN: [id number] Endoscopist: Gerrit Heck , MD Age: 57 Referring MD:  Date of Birth: [birth year] Gender: Female Account #: [id number] Procedure:                Colonoscopy Indications:              Screening for colorectal malignant neoplasm, This                            is the patient's first colonoscopy Medicines:                Monitored Anesthesia Care Procedure:                Pre-Anesthesia Assessment:                           - Prior to the procedure, a History and Physical                            was performed, and patient medications and                            allergies were reviewed. The patient's tolerance of                            previous anesthesia was also reviewed. The risks                            and benefits of the procedure and the sedation                            options and risks were discussed with the patient.                            All questions were answered, and informed consent                            was obtained. Prior Anticoagulants: The patient has                            taken no previous anticoagulant or antiplatelet                            agents. ASA Grade Assessment: II - A patient with                            mild systemic disease. After reviewing the risks                            and benefits, the patient was deemed in                            satisfactory condition to undergo the procedure.  After obtaining informed consent, the colonoscope                            was passed under direct vision. Throughout the                            procedure, the patient's blood pressure, pulse, and                            oxygen saturations were monitored continuously. The                            Colonoscope was introduced through the anus and                            advanced to the the cecum,  identified by                            appendiceal orifice and ileocecal valve. The                            colonoscopy was performed without difficulty. The                            patient tolerated the procedure well. The quality                            of the bowel preparation was good. Scope In: 1:25:38 PM Scope Out: 1:41:29 PM Scope Withdrawal Time: 0 hours 13 minutes 14 seconds  Total Procedure Duration: 0 hours 15 minutes 51 seconds  Findings:                 The perianal and digital rectal examinations were                            normal.                           Two sessile polyps were found in the sigmoid colon.                            The polyps were 1 to 2 mm in size. These polyps                            were removed with a cold biopsy forceps. Resection                            and retrieval were complete.                           A 3 mm polyp was found in the rectum. The polyp was                            sessile. The polyp was removed with a  cold snare.                            Resection and retrieval were complete. Estimated                            blood loss was minimal.                           A few small-mouthed diverticula were found in the                            sigmoid colon.                           The exam was otherwise without abnormality.                           The retroflexed view of the distal rectum and anal                            verge was normal and showed no anal or rectal                            abnormalities. Complications:            No immediate complications. Estimated Blood Loss:     Estimated blood loss was minimal. Impression:               - Two 1 to 2 mm polyps in the sigmoid colon,                            removed with a cold biopsy forceps. Resected and                            retrieved.                           - One 3 mm polyp in the rectum, removed with a cold                             snare. Resected and retrieved.                           - Diverticulosis in the sigmoid colon.                           - The examination was otherwise normal.                           - The distal rectum and anal verge are normal on                            retroflexion view. Recommendation:           - Patient has a contact number available for  emergencies. The signs and symptoms of potential                            delayed complications were discussed with the                            patient. Return to normal activities tomorrow.                            Written discharge instructions were provided to the                            patient.                           - Resume previous diet today.                           - Continue present medications.                           - Await pathology results.                           - Repeat colonoscopy in 5-10 years for surveillance                            based on pathology results.                           - Return to GI clinic PRN. Gerrit Heck, MD 10/17/2017 1:47:10 PM

## 2017-10-17 NOTE — Progress Notes (Signed)
Called to room to assist during endoscopic procedure.  Patient ID and intended procedure confirmed with present staff. Received instructions for my participation in the procedure from the performing physician.  

## 2017-10-17 NOTE — Patient Instructions (Signed)
YOU HAD AN ENDOSCOPIC PROCEDURE TODAY AT [facility]:   Refer to the procedure report that was given to you for any specific questions about what was found during the examination.  If the procedure report does not answer your questions, please call your gastroenterologist to clarify.  If you requested that your care partner not be given the details of your procedure findings, then the procedure report has been included in a sealed envelope for you to review at your convenience later.  YOU SHOULD EXPECT: Some feelings of bloating in the abdomen. Passage of more gas than usual.  Walking can help get rid of the air that was put into your GI tract during the procedure and reduce the bloating. If you had a lower endoscopy (such as a colonoscopy or flexible sigmoidoscopy) you may notice spotting of blood in your stool or on the toilet paper. If you underwent a bowel prep for your procedure, you may not have a normal bowel movement for a few days.  Please Note:  You might notice some irritation and congestion in your nose or some drainage.  This is from the oxygen used during your procedure.  There is no need for concern and it should clear up in a day or so.  SYMPTOMS TO REPORT IMMEDIATELY:   Following lower endoscopy (colonoscopy or flexible sigmoidoscopy):  Excessive amounts of blood in the stool  Significant tenderness or worsening of abdominal pains  Swelling of the abdomen that is new, acute  Fever of 100F or higher  For urgent or emergent issues, a gastroenterologist can be reached at any hour by calling [phone].   DIET:  We do recommend a small meal at first, but then you may proceed to your regular diet.  Drink plenty of fluids but you should avoid alcoholic beverages for 24 hours.  MEDICATIONS: Continue present medications.  Please see handouts given to you by your recovery nurse.  Return to GI clinic as needed.  ACTIVITY:  You should plan to take it easy for the  rest of today and you should NOT DRIVE or use heavy machinery until tomorrow (because of the sedation medicines used during the test).    FOLLOW UP: Our staff will call the number listed on your records the next business day following your procedure to check on you and address any questions or concerns that you may have regarding the information given to you following your procedure. If we do not reach you, we will leave a message.  However, if you are feeling well and you are not experiencing any problems, there is no need to return our call.  We will assume that you have returned to your regular daily activities without incident.  If any biopsies were taken you will be contacted by phone or by letter within the next 1-3 weeks.  Please call us at [phone] if you have not heard about the biopsies in 3 weeks.   Thank you for allowing [place] to provide for your healthcare needs today.   SIGNATURES/CONFIDENTIALITY: You and/or your care partner have signed paperwork which will be entered into your electronic medical record.  These signatures attest to the fact that that the information above on your After Visit Summary has been reviewed and is understood.  Full responsibility of the confidentiality of this discharge information lies with you and/or your care-partner.

## 2017-10-17 NOTE — Progress Notes (Signed)
To PACU, VSS. Report to [email or web address] 

## 2017-10-17 NOTE — Progress Notes (Signed)
I have reviewed the patient's medical history in detail and updated the computerized patient record.

## 2017-10-18 ENCOUNTER — Telehealth: Payer: Self-pay

## 2017-10-18 NOTE — Telephone Encounter (Signed)
  Follow up Call-  Call back number 10/17/2017  Post procedure Call Back phone  # [phone]  Permission to leave phone message Yes  Some recent data might be hidden     Patient questions:  Do you have a fever, pain , or abdominal swelling? No. Pain Score  0 *  Have you tolerated food without any problems? Yes.    Have you been able to return to your normal activities? Yes.    Do you have any questions about your discharge instructions: Diet   No. Medications  No. Follow up visit  No.  Do you have questions or concerns about your Care? No.  Actions: * If pain score is 4 or above: No action needed, pain <4.

## 2017-10-22 ENCOUNTER — Inpatient Hospital Stay: Admission: RE | Admit: 2017-10-22 | Payer: Managed Care, Other (non HMO) | Source: Ambulatory Visit

## 2017-10-22 ENCOUNTER — Encounter: Payer: Self-pay | Admitting: Gastroenterology

## 2017-10-24 ENCOUNTER — Other Ambulatory Visit: Payer: Self-pay | Admitting: Pulmonary Disease

## 2017-10-24 DIAGNOSIS — G4733 Obstructive sleep apnea (adult) (pediatric): Secondary | ICD-10-CM

## 2017-10-30 ENCOUNTER — Telehealth: Payer: Self-pay | Admitting: Pulmonary Disease

## 2017-10-30 NOTE — Telephone Encounter (Signed)
Spoke with patient, made aware due to insurance she has to have a home sleep test instead of the in-lab split night, informed our office would call her to schedule HST. Voiced understanding. Nothing further needed at this time.

## 2017-11-02 ENCOUNTER — Encounter (HOSPITAL_BASED_OUTPATIENT_CLINIC_OR_DEPARTMENT_OTHER): Payer: Managed Care, Other (non HMO)

## 2017-11-13 ENCOUNTER — Telehealth: Payer: Self-pay | Admitting: Pulmonary Disease

## 2017-11-13 NOTE — Telephone Encounter (Signed)
ATC pt- unable to leave vm, as mailbox has not been setup. Will call back

## 2017-11-14 ENCOUNTER — Ambulatory Visit: Payer: Managed Care, Other (non HMO) | Admitting: Pulmonary Disease

## 2017-11-14 NOTE — Telephone Encounter (Signed)
[email or web address]

## 2017-11-15 NOTE — Telephone Encounter (Signed)
Looks like [facility] called patient to reschedule picking up her HST As of now she is scheduled to pick this up tomorrow 9.20.19  ATC patient - line rang numerous times with no answer and no option to LM Per triage protocol, will sign off

## 2017-11-18 DIAGNOSIS — G4733 Obstructive sleep apnea (adult) (pediatric): Secondary | ICD-10-CM

## 2017-11-19 DIAGNOSIS — G4733 Obstructive sleep apnea (adult) (pediatric): Secondary | ICD-10-CM

## 2017-11-21 ENCOUNTER — Other Ambulatory Visit: Payer: Self-pay | Admitting: *Deleted

## 2017-11-21 DIAGNOSIS — G4733 Obstructive sleep apnea (adult) (pediatric): Secondary | ICD-10-CM

## 2017-11-23 ENCOUNTER — Telehealth: Payer: Self-pay | Admitting: Pulmonary Disease

## 2017-11-23 DIAGNOSIS — G4733 Obstructive sleep apnea (adult) (pediatric): Secondary | ICD-10-CM

## 2017-11-23 NOTE — Telephone Encounter (Signed)
Dr. Ander Slade has reviewed the home sleep test this showed Severe Obstructive sleep apnea with severe oxygen desaturation.   Recommendations   Treatment options are CPAP with the settings auto 5 to 15.    Weight loss measures .   Advise against driving while sleepy & against medication with sedative side effects.    Make appointment for 8 to 10 weeks for compliance with download with Dr. Ander Slade.   ATC Patient but no voicemail  Will call back.

## 2017-11-23 NOTE — Telephone Encounter (Signed)
Patient is aware and order has been placed and apt made.

## 2017-12-03 ENCOUNTER — Ambulatory Visit: Payer: Managed Care, Other (non HMO)

## 2018-01-04 ENCOUNTER — Ambulatory Visit: Payer: Managed Care, Other (non HMO) | Admitting: Pulmonary Disease

## 2018-02-04 ENCOUNTER — Ambulatory Visit: Payer: Managed Care, Other (non HMO) | Admitting: Pulmonary Disease

## 2018-04-04 ENCOUNTER — Encounter: Payer: Managed Care, Other (non HMO) | Admitting: Internal Medicine

## 2018-04-10 ENCOUNTER — Encounter: Payer: Self-pay | Admitting: *Deleted

## 2018-04-23 ENCOUNTER — Encounter: Payer: Self-pay | Admitting: Internal Medicine

## 2018-04-25 ENCOUNTER — Encounter: Payer: Managed Care, Other (non HMO) | Admitting: Internal Medicine

## 2018-05-09 ENCOUNTER — Encounter: Payer: Self-pay | Admitting: Internal Medicine

## 2018-05-09 ENCOUNTER — Other Ambulatory Visit: Payer: Self-pay

## 2018-05-09 ENCOUNTER — Ambulatory Visit: Payer: Managed Care, Other (non HMO) | Admitting: Internal Medicine

## 2018-05-09 ENCOUNTER — Encounter (INDEPENDENT_AMBULATORY_CARE_PROVIDER_SITE_OTHER): Payer: Self-pay

## 2018-05-09 DIAGNOSIS — Z79899 Other long term (current) drug therapy: Secondary | ICD-10-CM | POA: Diagnosis not present

## 2018-05-09 DIAGNOSIS — I10 Essential (primary) hypertension: Secondary | ICD-10-CM | POA: Diagnosis not present

## 2018-05-09 DIAGNOSIS — R208 Other disturbances of skin sensation: Secondary | ICD-10-CM | POA: Insufficient documentation

## 2018-05-09 DIAGNOSIS — R1110 Vomiting, unspecified: Secondary | ICD-10-CM | POA: Diagnosis not present

## 2018-05-09 DIAGNOSIS — K529 Noninfective gastroenteritis and colitis, unspecified: Secondary | ICD-10-CM | POA: Insufficient documentation

## 2018-05-09 MED ORDER — LISINOPRIL-HYDROCHLOROTHIAZIDE 20-25 MG PO TABS: 1.0000 | ORAL_TABLET | Freq: Every day | ORAL | 3 refills | Status: DC

## 2018-05-09 MED ORDER — METOPROLOL SUCCINATE ER 50 MG PO TB24: 50.0000 mg | ORAL_TABLET | Freq: Every day | ORAL | 3 refills | Status: DC

## 2018-05-09 NOTE — Assessment & Plan Note (Addendum)
The patient's blood pressure during this visit was 188/83--> Repeat number 164/80. She has been out of her medications for a week. His last blood pressure visits are  BP Readings from Last 3 Encounters:  05/09/18 (!) 188/83  10/17/17 (!) 143/88  10/04/17 (!) 156/88    The patient does not report palpitations, dizziness, chest pain, sob. But reports some burning and occasional cramp on his both legs.  Assessment and Plan -Refilled Lisinopril-HCTZ 20-25 mg QD and Metoprolol 50 mg QD -BMP today -Advised patient to call pharmacy or our clinic for refill before she runs of her medications in future.

## 2018-05-09 NOTE — Patient Instructions (Addendum)
Thank you for allowing [place] to provide your care today. Today, he came in due to burning sensation on your legs. I have ordered some blood test for you. I will call if any are abnormal.   Your blood pressure was elevated today.  I understand that you were out of your medications for a week.  I refill your meds for you.  Please pick them up from the pharmacy and take them regularly.  (These be aware the next time that you can call your pharmacy or our clinic for refill for you run out of your medications) Today we did not make any changes to your medications.   You were also concern about episode of vomiting and loose stool 2 days ago. No concern at this moment, but you may come back to clinic if your symptoms reoccur. Please follow-up in the neck in a month.  Should you have any questions or concerns please call the internal medicine clinic at [phone].

## 2018-05-09 NOTE — Assessment & Plan Note (Addendum)
Patient has recurrence of burning sensation of both lower extremities. Also reports some occasional cramping. No pain. She mentions she had similar symptoms when she was diagnosed with IDA. (Last HbA1c 2 years ago was 5.4) I will check basic metabolic panel for K, also screen her for Anemia, DM and will repeat TSH. She will follow up in a month. If no ureversible causes and if this bothers her a lot, we can try gabapentin.  -HbA1c -BMP -CBC -Ferritin -TSH -F/u in clinic in a month

## 2018-05-09 NOTE — Assessment & Plan Note (Signed)
Patient reports transient GI symptoms that occurred 2 days ago. Mentioning she would like to know if she can go back to work. (She works as Nurse, mental health) She had 1 episode of vomiting and 2 episode of non bloody loose stool 2 days ago. No fever and no abdominal pain. No respiratory symptoms with that. She denies any recent travel inside or outside of [place]. She has been symptoms free since then and feels well.   Assessment and Plan: Likely transient gastroenteritis. Transient vomiting and loose stool 2 days ago, without respiratory symptoms and without risk factor for Corona virus. She is afebrile and exam is unremarkable. Not concerning for Corona virus currently and does not have criteria for screening.   -No work up needed -Notified her that she can go back to work but recommended to come back to clinic if got symptomatic again.

## 2018-05-09 NOTE — Progress Notes (Signed)
   CC: Burning sensation on both legs  HPI:  Emily Schmitt is a 58 y.o. with past medical history as listed below, presented to clinic due to burning sensation on both legs.  She also reports 2 episodes of vomiting and loose stool 2 days ago that completely resolved.  She is asking if she can go back to work. please refer to problem based charting for further details and assessment and plan.  Past Medical History:  Diagnosis Date  . Allergic rhinitis   . Hypertension   . Obesity   . OSA (obstructive sleep apnea) 2006   Dx 7/06 with RDI 145.6 and desat to 62%. CPAP at 11 reduced RDI to 7.1. Repeat testing Aug 2006 showed RDI 0.7 on CPAP of 0.7  . Osteoarthritis    Of knees B. Uses hydrocodone sparingly.   . Osteoarthritis   . Sleep apnea    Review of Systems:  Review of Systems  Constitutional: Negative for chills and fever.  Respiratory: Negative for cough and shortness of breath.   Gastrointestinal: Positive for vomiting. Negative for abdominal pain, diarrhea and nausea.  Neurological: Negative for dizziness, sensory change, weakness and headaches.    Physical Exam:  Vitals:   05/09/18 1524  BP: (!) 188/83  Pulse: 94  Temp: 98.3 F (36.8 C)  TempSrc: Oral  SpO2: 100%  Weight: 291 lb 12.8 oz (132.4 kg)  Height: 5' 11.5" (1.816 m)   General: Pleasant lady sitting on the chair with no acute distress Head and neck: Normocephalic and atraumatic Eyes: Extraocular movements are normal CV: RRR, normal S1-S2, no murmur Pulmonary exam: No respiratory distress, CTA bilaterally, no wheeze, no crackle Abdomen: Is soft and nontender to palpation, BS are present Extremities: Trace bilateral pitting edema Neurologic exam: Patient is alert and oriented x3, sensation is intact bilaterally at lower extremities.  Assessment & Plan:   See Encounters Tab for problem based charting.  Patient discussed with Dr. Rebeca Alert

## 2018-05-10 LAB — BMP8+ANION GAP
ANION GAP: 17 mmol/L (ref 10.0–18.0)
BUN/Creatinine Ratio: 14 (ref 12–28)
BUN: 8 mg/dL (ref 8–27)
CALCIUM: 9.2 mg/dL (ref 8.7–10.3)
CO2: 21 mmol/L (ref 20–29)
Chloride: 104 mmol/L (ref 96–106)
Creatinine, Ser: 0.57 mg/dL (ref 0.57–1.00)
GFR calc Af Amer: 117 mL/min/{1.73_m2} (ref >59)
GFR calc non Af Amer: 101 mL/min/{1.73_m2} (ref >59)
Glucose: 166 mg/dL — ABNORMAL HIGH (ref 65–99)
Potassium: 3.6 mmol/L (ref 3.5–5.2)
Sodium: 142 mmol/L (ref 134–144)

## 2018-05-10 LAB — CBC
HEMATOCRIT: 37.6 % (ref 34.0–46.6)
HEMOGLOBIN: 12.2 g/dL (ref 11.1–15.9)
MCH: 30 pg (ref 26.6–33.0)
MCHC: 32.4 g/dL (ref 31.5–35.7)
MCV: 93 fL (ref 79–97)
Platelets: 234 10*3/uL (ref 150–450)
RBC: 4.06 x10E6/uL (ref 3.77–5.28)
RDW: 13.6 % (ref 11.7–15.4)
WBC: 3.9 10*3/uL (ref 3.4–10.8)

## 2018-05-10 LAB — TSH: TSH: 2.63 u[IU]/mL (ref 0.450–4.500)

## 2018-05-10 LAB — HEMOGLOBIN A1C
Est. average glucose Bld gHb Est-mCnc: 128 mg/dL
Hgb A1c MFr Bld: 6.1 % — ABNORMAL HIGH (ref 4.8–5.6)

## 2018-05-10 LAB — FERRITIN: Ferritin: 117 ng/mL (ref 15–150)

## 2018-05-10 NOTE — Progress Notes (Signed)
Internal Medicine Clinic Attending  Case discussed with Dr. Masoudi at the time of the visit.  We reviewed the resident's history and exam and pertinent patient test results.  I agree with the assessment, diagnosis, and plan of care documented in the resident's note.  Alexander Raines, M.D., Ph.D.  

## 2018-09-16 ENCOUNTER — Encounter: Payer: Managed Care, Other (non HMO) | Admitting: Internal Medicine

## 2018-09-23 ENCOUNTER — Other Ambulatory Visit: Payer: Self-pay

## 2018-09-23 ENCOUNTER — Ambulatory Visit (INDEPENDENT_AMBULATORY_CARE_PROVIDER_SITE_OTHER): Payer: Managed Care, Other (non HMO) | Admitting: Internal Medicine

## 2018-09-23 ENCOUNTER — Encounter: Payer: Self-pay | Admitting: Internal Medicine

## 2018-09-23 VITALS — BP 123/88 | HR 93 | Temp 98.3°F | Ht 71.5 in | Wt 292.2 lb

## 2018-09-23 DIAGNOSIS — H538 Other visual disturbances: Secondary | ICD-10-CM

## 2018-09-23 DIAGNOSIS — Z0000 Encounter for general adult medical examination without abnormal findings: Secondary | ICD-10-CM

## 2018-09-23 DIAGNOSIS — I10 Essential (primary) hypertension: Secondary | ICD-10-CM

## 2018-09-23 DIAGNOSIS — R7303 Prediabetes: Secondary | ICD-10-CM | POA: Diagnosis not present

## 2018-09-23 DIAGNOSIS — Z79899 Other long term (current) drug therapy: Secondary | ICD-10-CM

## 2018-09-23 LAB — POCT GLYCOSYLATED HEMOGLOBIN (HGB A1C): Hemoglobin A1C: 6.1 % — AB (ref 4.0–5.6)

## 2018-09-23 LAB — GLUCOSE, CAPILLARY: Glucose-Capillary: 129 mg/dL — ABNORMAL HIGH (ref 70–99)

## 2018-09-23 MED ORDER — METOPROLOL SUCCINATE ER 50 MG PO TB24: 50.0000 mg | ORAL_TABLET | Freq: Every day | ORAL | 3 refills | Status: DC

## 2018-09-23 MED ORDER — LISINOPRIL-HYDROCHLOROTHIAZIDE 20-25 MG PO TABS: 1.0000 | ORAL_TABLET | Freq: Every day | ORAL | 3 refills | Status: DC

## 2018-09-23 NOTE — Patient Instructions (Signed)
It was our pleasure taking care of you in our clinic today.   Your blood pressure was normal today. I sent refill fot your medications to your pharmacy. Please pick them up and take them as before. I recheck Hb A1c test (for diabetes) today and call you if the result will be abnormal. I sen a referral to ophthalmologist to check your eyes. (because you reports blurred vision and since your eye exam is suggestive of probable cataract)  Please take your medications as instructed and contact us if you have any question or concern. Please come back to clinic in 3 months for f/u or earlier as needed. As always, if having severe symptoms, please seek medical attention at emergency room.  Thank you

## 2018-09-23 NOTE — Assessment & Plan Note (Signed)
Emily Schmitt declines Tdap vaccine and pap smear today but is willing to do it on next visit.

## 2018-09-23 NOTE — Assessment & Plan Note (Addendum)
The patient's blood pressure during this visit was 155/83-->repeat BP: normal at 123/88. She reports compliance to Lisinopril-HCTZ 20-25 mg QD and Metoprolol 50 mg QD. The patient does not report palpitations, dizziness, chest pain, sob. Last BMP 4 months ago was without electrolyte abnormalities.  Assessment and Plan -Continue current antihypertensive regimen (sent refill for Metoprolol and Lisinopril-HCTZ) -Will check BMP next visit -F/u in clinic in 3-6 months

## 2018-09-23 NOTE — Assessment & Plan Note (Addendum)
Patient reports having right eye blurred vision that started few months ago. She denies any eye pain, redness or eye discharge. No headache, nausea or vomiting. No double vision. On exam: Has decreased vision acuity of right eye (can not count fingers) Has white discoloration/hazziness on eye exam that can be due to hazy lens No conjunctival erythema, pupils are symmetric, normal size and reactive to light. Peripheral vision is intact. Can be cataract. No symptoms suggestive of acute (close angle) glaucoma. Her last HbA1c 3 months ago showed prediabetic state. Less likely to be diabetic retinopathy but will repeat HbA1c today and will need complete eye exam.  -Ambulatory referral to ophthalmologist -F/u in clinic in 3 months

## 2018-09-23 NOTE — Progress Notes (Addendum)
   CC: HTN follow up  HPI:  [name] is a 58 y.o. with PMHx as documented below, presented for HTN follow up. She also complains of worsening of blurred vision of right eye. Please refer to problem based charting for further details and assessment and plan of current problem and chronic medical conditions.   Past Medical History:  Diagnosis Date  . Allergic rhinitis   . Hypertension   . Obesity   . OSA (obstructive sleep apnea) 2006   Dx 7/06 with RDI 145.6 and desat to 62%. CPAP at 11 reduced RDI to 7.1. Repeat testing Aug 2006 showed RDI 0.7 on CPAP of 0.7  . Osteoarthritis    Of knees B. Uses hydrocodone sparingly.   . Osteoarthritis   . Sleep apnea    Review of Systems:   Review of Systems  Constitutional: Negative for chills and fever.  Respiratory: Negative for cough and shortness of breath.   Cardiovascular: Negative for chest pain.  Neurological: Negative for dizziness and headaches.    Physical Exam:  Vitals:   09/23/18 1329 09/23/18 1335  BP: (!) 155/83 123/88  Pulse: (!) 101 93  Temp: 98.3 F (36.8 C)   TempSrc: Oral   SpO2: 99%   Weight: 292 lb 3.2 oz (132.5 kg)   Height: 5' 11.5" (1.816 m)    Physical Exam  Constitutional: pleasant obese lady. No acute distress.  HENT:  Eyes: Conjunctivae are normal, pupils have normal size and reacts to light bilaterally, EOM nl, decreased visual acuity of right eye, white discoloration/hazziness visible at right eye peripheral lesion is intact. Cardiovascular: RRR, nl S1S2, no murmur Respiratory: Effort normal and breath sounds normal. No respiratory distress. No wheezes.  GI: Soft. Bowel sounds are normal. No distension. There is no tenderness.  Neurological: Is alert and oriented x 3    Assessment & Plan:   See Encounters Tab for problem based charting.  Patient discussed with Dr. Daryll Drown

## 2018-09-23 NOTE — Progress Notes (Signed)
Internal Medicine Clinic Attending  Case discussed with Dr. Masoudi  at the time of the visit.  We reviewed the resident's history and exam and pertinent patient test results.  I agree with the assessment, diagnosis, and plan of care documented in the resident's note.  

## 2018-09-23 NOTE — Assessment & Plan Note (Signed)
HbA1c 3 months ago was 6.1, and random BG 166. She denies polyuria or polydipsia. (Althought she some times drink more water when during hot days). Will manage her pre DM with life style modification- diet and exercise.  -Repeat HbA1c today -f/u in clinic in 3-6 months

## 2018-12-26 ENCOUNTER — Encounter: Payer: Managed Care, Other (non HMO) | Admitting: Internal Medicine

## 2019-01-06 ENCOUNTER — Encounter: Payer: Self-pay | Admitting: Internal Medicine

## 2019-01-06 ENCOUNTER — Ambulatory Visit: Payer: Managed Care, Other (non HMO) | Admitting: Internal Medicine

## 2019-01-06 VITALS — BP 150/70 | HR 96 | Temp 98.2°F | Ht 71.5 in | Wt 292.5 lb

## 2019-01-06 DIAGNOSIS — Z79899 Other long term (current) drug therapy: Secondary | ICD-10-CM | POA: Diagnosis not present

## 2019-01-06 DIAGNOSIS — Z0000 Encounter for general adult medical examination without abnormal findings: Secondary | ICD-10-CM

## 2019-01-06 DIAGNOSIS — I10 Essential (primary) hypertension: Secondary | ICD-10-CM | POA: Diagnosis not present

## 2019-01-06 DIAGNOSIS — Z23 Encounter for immunization: Secondary | ICD-10-CM | POA: Diagnosis not present

## 2019-01-06 MED ORDER — LISINOPRIL-HYDROCHLOROTHIAZIDE 20-25 MG PO TABS: 1.0000 | ORAL_TABLET | Freq: Every day | ORAL | 1 refills | Status: DC

## 2019-01-06 MED ORDER — BLOOD PRESSURE CUFF MISC: 1.0000 | Freq: Two times a day (BID) | 0 refills | Status: AC

## 2019-01-06 MED ORDER — METOPROLOL SUCCINATE ER 50 MG PO TB24: 50.0000 mg | ORAL_TABLET | Freq: Every day | ORAL | 1 refills | Status: DC

## 2019-01-06 NOTE — Progress Notes (Signed)
   CC: HTN follow up  HPI:  [name] is a 58 y.o. female with PMHx as documented below, presented for HTN follow up. Please refer to problem based charting for further details and assessment of plan of current problem and chronic medical conditions.   Past Medical History:  Diagnosis Date  . Allergic rhinitis   . Hypertension   . Obesity   . OSA (obstructive sleep apnea) 2006   Dx 7/06 with RDI 145.6 and desat to 62%. CPAP at 11 reduced RDI to 7.1. Repeat testing Aug 2006 showed RDI 0.7 on CPAP of 0.7  . Osteoarthritis    Of knees B. Uses hydrocodone sparingly.   . Osteoarthritis   . Sleep apnea    Review of Systems:  Review of Systems  Constitutional: Negative for chills and fever.  Eyes: Negative for blurred vision and pain.  Respiratory: Negative for cough and shortness of breath.   Cardiovascular: Negative for chest pain.  Neurological: Negative for dizziness and headaches.     Physical Exam:  Vitals:   01/06/19 1343  BP: (!) 150/70  Pulse: 96  Temp: 98.2 F (36.8 C)  TempSrc: Oral  SpO2: 98%  Weight: 292 lb 8 oz (132.7 kg)  Height: 5' 11.5" (1.816 m)   Physical Exam  Constitutional: Well-developed and well-nourished. No acute distress.  HENT:  Head: Normocephalic and atraumatic.  Cardiovascular: RRR, nl S1S2, no murmur,  trace LEE Respiratory: Effort normal and breath sounds normal. No respiratory distress. No wheezes.  GI: Soft. Bowel sounds are normal. No distension. There is no tenderness.  Neurological: Is alert and oriented x 3  Skin: Not diaphoretic. No erythema.  Psychiatric:  Normal mood and affect. Behavior is normal. Judgment and thought content normal.  Skin: No rash    Assessment & Plan:   See Encounters Tab for problem based charting.  Patient discussed with Dr. Philipp Ovens

## 2019-01-06 NOTE — Assessment & Plan Note (Signed)
The patient's blood pressure during this visit was 150/70>>149/70. The patient is currently taking Lisinopril-HCTZ 20-25 mg QD and Metoprolol 50 mg QD. She does not check her BP at home. She will take benefitt of ambulatory blood pressure monitoring.  -Recommeded to get a BP cuff to check BP twice daily and record it. Provided log book  -Continue current regimen. Will adjust medications as needed after ambulatory BP monitoring  -BMP today -F/u in clinic in 1-2 months

## 2019-01-06 NOTE — Assessment & Plan Note (Signed)
Pt received Tdap and flu vaccin today. She would like to post pone Pap smear.

## 2019-01-06 NOTE — Patient Instructions (Addendum)
It was our pleasure taking care of you in our clinic today. Your blood pressure is slightly elevated today. I encourage you to get a blood pressure device and to check your blood pressure at home.  Please check your blood pressure twice a day (in the morning and the evening) or use the public blood pressure devices in pharmacy strors twice weekly and record your blood pressure numbers on the logbook I provide for you and bring it to [place] next time.   Meanwhile, please take your medications as before. I send refill for your medications. Please peak them up and continue taking them as before. Try to avoid salty food.  I send you to lab for some blood work and call you if the results will be abnormal. We also provided you with flu and also tetanus vaccine today.  Please take your medications as instructed and contact us if you have any question or concern.  Please come back to clinic in 1-2 months or earlier if required.  Thank you

## 2019-01-07 LAB — BMP8+ANION GAP
Anion Gap: 15 mmol/L (ref 10.0–18.0)
BUN/Creatinine Ratio: 15 (ref 12–28)
BUN: 12 mg/dL (ref 8–27)
CO2: 24 mmol/L (ref 20–29)
Calcium: 9.3 mg/dL (ref 8.7–10.3)
Chloride: 101 mmol/L (ref 96–106)
Creatinine, Ser: 0.8 mg/dL (ref 0.57–1.00)
GFR calc Af Amer: 93 mL/min/{1.73_m2} (ref >59)
GFR calc non Af Amer: 80 mL/min/{1.73_m2} (ref >59)
Glucose: 96 mg/dL (ref 65–99)
Potassium: 3.9 mmol/L (ref 3.5–5.2)
Sodium: 140 mmol/L (ref 134–144)

## 2019-01-07 NOTE — Progress Notes (Signed)
Internal Medicine Clinic Attending  Case discussed with Dr. Masoudi  at the time of the visit.  We reviewed the resident's history and exam and pertinent patient test results.  I agree with the assessment, diagnosis, and plan of care documented in the resident's note.  

## 2019-01-07 NOTE — Addendum Note (Signed)
Addended by: Jodean Lima on: 01/07/2019 02:11 PM   Modules accepted: Level of Service

## 2019-03-27 ENCOUNTER — Encounter: Payer: Managed Care, Other (non HMO) | Admitting: Internal Medicine

## 2019-04-03 ENCOUNTER — Ambulatory Visit: Payer: Managed Care, Other (non HMO) | Admitting: Internal Medicine

## 2019-04-03 DIAGNOSIS — M1710 Unilateral primary osteoarthritis, unspecified knee: Secondary | ICD-10-CM | POA: Diagnosis not present

## 2019-04-03 DIAGNOSIS — Z79899 Other long term (current) drug therapy: Secondary | ICD-10-CM

## 2019-04-03 DIAGNOSIS — I10 Essential (primary) hypertension: Secondary | ICD-10-CM

## 2019-04-03 DIAGNOSIS — M170 Bilateral primary osteoarthritis of knee: Secondary | ICD-10-CM

## 2019-04-03 MED ORDER — LISINOPRIL-HYDROCHLOROTHIAZIDE 20-25 MG PO TABS: 1.0000 | ORAL_TABLET | Freq: Every day | ORAL | 2 refills | Status: DC

## 2019-04-03 MED ORDER — METOPROLOL SUCCINATE ER 50 MG PO TB24: 50.0000 mg | ORAL_TABLET | Freq: Every day | ORAL | 2 refills | Status: DC

## 2019-04-03 MED ORDER — IBUPROFEN 600 MG PO TABS: 600.0000 mg | ORAL_TABLET | Freq: Three times a day (TID) | ORAL | 0 refills | Status: AC | PRN

## 2019-04-03 NOTE — Assessment & Plan Note (Signed)
Patient asks for Ibuprofen 600 mg for occasional knee pain. She sometimes has moderate knee pain after long day activity.  -Sent prescription for Ibuprofen 600 mg 2-3 times a day PRN. -Explained to patient to take it with food and avoid frequent and long term use of NSAID. Educated her about kidney and GI side effect. She verbalizes understanding

## 2019-04-03 NOTE — Assessment & Plan Note (Addendum)
Patient BP today was 147/77 on arrival that improved to 138/77. Patient is trying to have low salt diet and no fried food to lose weight.  She has bought the blood pressure device at home and check the blood pressure daily. She does not have the log book  with her but mentions that the highest number is 140-150. Usually SBP 130s-140s.  I will continue curremnt medicatioms as she is willing to continue diet control and tries to lose weight and this can improve her blood pressure. -Sending refills for Lisinopril-HCTZ 20-25 mg QD -Metoprolol succinate 50 mg QD -Return in clinic in 2 months -BMP next visit

## 2019-04-03 NOTE — Progress Notes (Deleted)
   CC: ***  HPI:  [name] is a 59 y.o.   Past Medical History:  Diagnosis Date  . Allergic rhinitis   . Hypertension   . Obesity   . OSA (obstructive sleep apnea) 2006   Dx 7/06 with RDI 145.6 and desat to 62%. CPAP at 11 reduced RDI to 7.1. Repeat testing Aug 2006 showed RDI 0.7 on CPAP of 0.7  . Osteoarthritis    Of knees B. Uses hydrocodone sparingly.   . Osteoarthritis   . Sleep apnea    Review of Systems:  ***  Physical Exam:  There were no vitals filed for this visit. ***  Assessment & Plan:   See Encounters Tab for problem based charting.  Patient {GC/GE:[id number]::"discussed with","seen with"} Dr. {NAMES:[id number]::"Butcher","Guilloud","Hoffman","Mullen","Narendra","Raines","Vincent"}

## 2019-04-03 NOTE — Progress Notes (Signed)
   CC: HTN follow up  HPI:  [name] is a 59 y.o. female with PMHx as documented below, presented for HTN follow up. Please refer to problem based charting for further details and assessment and plan of current problem and chronic medical conditions.   Past Medical History:  Diagnosis Date  . Allergic rhinitis   . Hypertension   . Obesity   . OSA (obstructive sleep apnea) 2006   Dx 7/06 with RDI 145.6 and desat to 62%. CPAP at 11 reduced RDI to 7.1. Repeat testing Aug 2006 showed RDI 0.7 on CPAP of 0.7  . Osteoarthritis    Of knees B. Uses hydrocodone sparingly.   . Osteoarthritis   . Sleep apnea    Review of Systems: Constitutional: Negative for chills and fever.  Respiratory: Negative for shortness of breath.   Cardiovascular: Negative for chest pain and leg swelling.   Gastrointestinal: Negative for abdominal pain, nausea and vomiting.  Neurological: Negative for dizziness and headaches.    Physical Exam:  Vitals:   04/03/19 1557 04/03/19 1617  BP: (!) 147/77 138/77  Pulse: 75 70  Temp: 98.3 F (36.8 C)   TempSrc: Oral   SpO2: 100%   Weight: 294 lb (133.4 kg)   Height: 5' 11.5" (1.816 m)    Physical Exam  Constitutional: Well-developed and well-nourished. No acute distress.  HENT:  Head: Normocephalic and atraumatic.  Eyes: Conjunctivae are normal, EOM nl Cardiovascular:  RRR, nl S1S2, no murmur,  no LEE Respiratory: Effort normal and breath sounds normal. No respiratory distress. No wheezes.  GI: Soft. Bowel sounds are normal. No distension. There is no tenderness.  Neurological: Is alert and oriented x 3  Skin: Not diaphoretic. No erythema.  Psychiatric:  Normal mood and affect. Behavior is normal. Judgment and thought content normal.    Assessment & Plan:   See Encounters Tab for problem based charting.  Patient discussed with Dr. Lynnae January

## 2019-04-03 NOTE — Patient Instructions (Signed)
It was our pleasure taking care of you in our clinic today.   Your blood pressure improved today. Please continue great job of having healthy diet to lose weight. Check you blood pressure at least twice a day and bring the log book with you next time. Continue taking your medications as before (I sent refill to your pharmacy) Come back to clinic in 3 months for blood pressure check and blood work. I also sent script for Ibuprofen for your occassional knee pain. Please take it only as needed, and with food and water as discussed.  Thank you

## 2019-04-07 NOTE — Progress Notes (Signed)
Internal Medicine Clinic Attending  Case discussed with Dr. Masoudi  at the time of the visit.  We reviewed the resident's history and exam and pertinent patient test results.  I agree with the assessment, diagnosis, and plan of care documented in the resident's note.  

## 2019-05-15 ENCOUNTER — Ambulatory Visit: Payer: Managed Care, Other (non HMO) | Attending: Family

## 2019-05-15 DIAGNOSIS — Z23 Encounter for immunization: Secondary | ICD-10-CM

## 2019-05-15 NOTE — Progress Notes (Signed)
   Covid-19 Vaccination Clinic  Name:  Emily Schmitt    MRN: [id number] DOB: 1960/04/02  05/15/2019  [name] was observed post Covid-19 immunization for 15 minutes without incident. She was provided with Vaccine Information Sheet and instruction to access the V-Safe system.   Ms. Dial was instructed to call 911 with any severe reactions post vaccine: Marland Kitchen Difficulty breathing  . Swelling of face and throat  . A fast heartbeat  . A bad rash all over body  . Dizziness and weakness   Immunizations Administered    Name Date Dose VIS Date Route   Moderna COVID-19 Vaccine 05/15/2019  3:12 PM 0.5 mL 01/28/2019 Intramuscular   Manufacturer: Moderna   Lot: [id number]   [id number]

## 2019-06-17 ENCOUNTER — Ambulatory Visit: Payer: Managed Care, Other (non HMO) | Attending: Family

## 2019-06-17 DIAGNOSIS — Z23 Encounter for immunization: Secondary | ICD-10-CM

## 2019-06-17 NOTE — Progress Notes (Signed)
   Covid-19 Vaccination Clinic  Name:  Emily Schmitt    MRN: [id number] DOB: Nov 03, 1960  06/17/2019  [name] was observed post Covid-19 immunization for 15 minutes without incident. She was provided with Vaccine Information Sheet and instruction to access the V-Safe system.   [name] was instructed to call 911 with any severe reactions post vaccine: Marland Kitchen Difficulty breathing  . Swelling of face and throat  . A fast heartbeat  . A bad rash all over body  . Dizziness and weakness   Immunizations Administered    Name Date Dose VIS Date Route   Moderna COVID-19 Vaccine 06/17/2019  2:59 PM 0.5 mL 01/2019 Intramuscular   Manufacturer: Moderna   Lot: [id number]   [id number]

## 2023-07-12 ENCOUNTER — Ambulatory Visit: Payer: Self-pay | Admitting: Adult Health

## 2023-07-12 ENCOUNTER — Encounter: Payer: Self-pay | Admitting: Adult Health

## 2023-07-12 VITALS — BP 154/100 | HR 85 | Temp 97.2°F | Resp 20 | Ht 71.5 in | Wt 287.4 lb

## 2023-07-12 DIAGNOSIS — Z1231 Encounter for screening mammogram for malignant neoplasm of breast: Secondary | ICD-10-CM

## 2023-07-12 DIAGNOSIS — Z13820 Encounter for screening for osteoporosis: Secondary | ICD-10-CM | POA: Diagnosis not present

## 2023-07-12 DIAGNOSIS — I10 Essential (primary) hypertension: Secondary | ICD-10-CM

## 2023-07-12 DIAGNOSIS — R7303 Prediabetes: Secondary | ICD-10-CM

## 2023-07-12 DIAGNOSIS — Z7689 Persons encountering health services in other specified circumstances: Secondary | ICD-10-CM | POA: Diagnosis not present

## 2023-07-12 MED ORDER — METOPROLOL SUCCINATE ER 50 MG PO TB24: 50.0000 mg | ORAL_TABLET | Freq: Every day | ORAL | 1 refills | Status: DC

## 2023-07-12 MED ORDER — LISINOPRIL-HYDROCHLOROTHIAZIDE 20-25 MG PO TABS: 1.0000 | ORAL_TABLET | Freq: Every day | ORAL | 1 refills | Status: AC

## 2023-07-12 NOTE — Patient Instructions (Signed)
 Preventive Care 16-65 Years Old, Female  Preventive care refers to lifestyle choices and visits with your health care provider that can promote health and wellness. Preventive care visits are also called wellness exams.  What can I expect for my preventive care visit?  Counseling  Your health care provider may ask you questions about your:  Medical history, including:  Past medical problems.  Family medical history.  Pregnancy history.  Current health, including:  Menstrual cycle.  Method of birth control.  Emotional well-being.  Home life and relationship well-being.  Sexual activity and sexual health.  Lifestyle, including:  Alcohol, nicotine or tobacco, and drug use.  Access to firearms.  Diet, exercise, and sleep habits.  Work and work Astronomer.  Sunscreen use.  Safety issues such as seatbelt and bike helmet use.  Physical exam  Your health care provider will check your:  Height and weight. These may be used to calculate your BMI (body mass index). BMI is a measurement that tells if you are at a healthy weight.  Waist circumference. This measures the distance around your waistline. This measurement also tells if you are at a healthy weight and may help predict your risk of certain diseases, such as type 2 diabetes and high blood pressure.  Heart rate and blood pressure.  Body temperature.  Skin for abnormal spots.  What immunizations do I need?    Vaccines are usually given at various ages, according to a schedule. Your health care provider will recommend vaccines for you based on your age, medical history, and lifestyle or other factors, such as travel or where you work.  What tests do I need?  Screening  Your health care provider may recommend screening tests for certain conditions. This may include:  Lipid and cholesterol levels.  Diabetes screening. This is done by checking your blood sugar (glucose) after you have not eaten for a while (fasting).  Pelvic exam and Pap test.  Hepatitis B test.  Hepatitis C  test.  HIV (human immunodeficiency virus) test.  STI (sexually transmitted infection) testing, if you are at risk.  Lung cancer screening.  Colorectal cancer screening.  Mammogram. Talk with your health care provider about when you should start having regular mammograms. This may depend on whether you have a family history of breast cancer.  BRCA-related cancer screening. This may be done if you have a family history of breast, ovarian, tubal, or peritoneal cancers.  Bone density scan. This is done to screen for osteoporosis.  Talk with your health care provider about your test results, treatment options, and if necessary, the need for more tests.  Follow these instructions at home:  Eating and drinking    Eat a diet that includes fresh fruits and vegetables, whole grains, lean protein, and low-fat dairy products.  Take vitamin and mineral supplements as recommended by your health care provider.  Do not drink alcohol if:  Your health care provider tells you not to drink.  You are pregnant, may be pregnant, or are planning to become pregnant.  If you drink alcohol:  Limit how much you have to 0-1 drink a day.  Know how much alcohol is in your drink. In the [place] one drink equals one 12 oz bottle of beer (355 mL), one 5 oz glass of wine (148 mL), or one 1 oz glass of hard liquor (44 mL).  Lifestyle  Brush your teeth every morning and night with fluoride toothpaste. Floss one time each day.  Exercise for at least  30 minutes 5 or more days each week.  Do not use any products that contain nicotine or tobacco. These products include cigarettes, chewing tobacco, and vaping devices, such as e-cigarettes. If you need help quitting, ask your health care provider.  Do not use drugs.  If you are sexually active, practice safe sex. Use a condom or other form of protection to prevent STIs.  If you do not wish to become pregnant, use a form of birth control. If you plan to become pregnant, see your health care provider for a  prepregnancy visit.  Take aspirin only as told by your health care provider. Make sure that you understand how much to take and what form to take. Work with your health care provider to find out whether it is safe and beneficial for you to take aspirin daily.  Find healthy ways to manage stress, such as:  Meditation, yoga, or listening to music.  Journaling.  Talking to a trusted person.  Spending time with friends and family.  Minimize exposure to UV radiation to reduce your risk of skin cancer.  Safety  Always wear your seat belt while driving or riding in a vehicle.  Do not drive:  If you have been drinking alcohol. Do not ride with someone who has been drinking.  When you are tired or distracted.  While texting.  If you have been using any mind-altering substances or drugs.  Wear a helmet and other protective equipment during sports activities.  If you have firearms in your house, make sure you follow all gun safety procedures.  Seek help if you have been physically or sexually abused.  What's next?  Visit your health care provider once a year for an annual wellness visit.  Ask your health care provider how often you should have your eyes and teeth checked.  Stay up to date on all vaccines.  This information is not intended to replace advice given to you by your health care provider. Make sure you discuss any questions you have with your health care provider.  Document Revised: 08/11/2020 Document Reviewed: 08/11/2020  Elsevier Patient Education  2024 [employer].

## 2023-07-12 NOTE — Progress Notes (Signed)
 [facility] clinic  Provider:  Inge Mangle DNP  Code Status:  Full Code  Goals of Care:     04/03/2019    4:00 PM  Advanced Directives  Does Patient Have a Medical Advance Directive? No  Would patient like information on creating a medical advance directive? No - Patient declined     Chief Complaint  Patient presents with   New Patient (Initial Visit)    Discussed the use of AI scribe software for clinical note transcription with the patient, who gave verbal consent to proceed.  HPI: Patient is a 63 y.o. female seen today to establish care with [facility].  She has a history of hypertension and has been on lisinopril  HCTZ 20/25 mg daily and metoprolol  XL 50 mg daily. She has not taken her medications for approximately five months due to personal circumstances, including caring for her husband who had throat cancer.  She has a family history of diabetes, with her brother and sister being diabetic, and her son, who is 57 years old, also having diabetes. She was previously told she was borderline diabetic. She drinks a lot of water and sometimes feels bloated, but denies any issues with urination. She experiences occasional increased thirst and hunger.  Her social history includes no smoking and occasional alcohol consumption, with an intake of about two glasses of wine per week. She works at [employer], a job she enjoys as it is not physically strenuous. 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  She has a history of morbid obesity with a weight of 239.53 lbs. She attempts to maintain a low-carb diet and exercises about 30 minutes, five days a week, primarily through stretching and walking. She drinks a lot of water and occasionally consumes ginger ale.  Her family history includes her mother who died at 59 from seizures and her father who died from colon cancer. She has several siblings, with one  brother who had diabetes and an amputation, and a sister who is diabetic. She is one of eight siblings, with four brothers and four sisters.  She reports regular bowel movements without blood and consumes a lot of apple juice and vegetables. She has not had a mammogram or Pap smear recently and had a colonoscopy in 2019, which was normal.   She declines pap smear today.   Past Medical History:  Diagnosis Date   Allergic rhinitis    Hypertension    Obesity    OSA (obstructive sleep apnea) 2006   Dx 7/06 with RDI 145.6 and desat to 62%. CPAP at 11 reduced RDI to 7.1. Repeat testing Aug 2006 showed RDI 0.7 on CPAP of 0.7   Osteoarthritis    Of knees B. Uses hydrocodone sparingly.    Osteoarthritis    Sleep apnea     Past Surgical History:  Procedure Laterality Date   COLONOSCOPY N/A 10/17/2017   TUBAL LIGATION      No Known Allergies  Outpatient Encounter Medications as of 07/12/2023  Medication Sig   Blood Pressure Monitoring (BLOOD PRESSURE CUFF) MISC 1 Device by Does not apply route 2 (two) times daily.   [DISCONTINUED] lisinopril -hydrochlorothiazide  (ZESTORETIC ) 20-25 MG tablet Take 1 tablet by mouth daily.   [DISCONTINUED] metoprolol  succinate (TOPROL  XL) 50 MG 24 hr tablet Take 1 tablet (50 mg total) by mouth daily. Take with or immediately following a meal.   lisinopril -hydrochlorothiazide  (ZESTORETIC ) 20-25 MG tablet Take 1 tablet by  mouth daily.   metoprolol  succinate (TOPROL  XL) 50 MG 24 hr tablet Take 1 tablet (50 mg total) by mouth daily. Take with or immediately following a meal.   Facility-Administered Encounter Medications as of 07/12/2023  Medication   0.9 %  sodium chloride  infusion    Review of Systems:  Review of Systems  Constitutional:  Negative for appetite change, chills, fatigue and fever.  HENT:  Negative for congestion, hearing loss, rhinorrhea and sore throat.   Eyes: Negative.   Respiratory:  Negative for cough, shortness of breath and wheezing.    Cardiovascular:  Negative for chest pain, palpitations and leg swelling.  Gastrointestinal:  Negative for abdominal pain, constipation, diarrhea, nausea and vomiting.  Genitourinary:  Negative for dysuria.  Musculoskeletal:  Negative for arthralgias, back pain and myalgias.  Skin:  Negative for color change, rash and wound.  Neurological:  Negative for dizziness, weakness and headaches.  Psychiatric/Behavioral:  Negative for behavioral problems. The patient is not nervous/anxious.     Health Maintenance  Topic Date Due   Medicare Annual Wellness (AWV)  Never done   MAMMOGRAM  03/19/2008   Colonoscopy  10/18/2022   DEXA SCAN  Never done   COVID-19 Vaccine (3 - 2024-25 season) 07/28/2023 (Originally 10/29/2022)   Zoster Vaccines- Shingrix (1 of 2) 10/12/2023 (Originally 03/19/2008)   Pneumonia Vaccine 65+ Years old (1 of 1 - PCV) 07/11/2024 (Originally 03/19/2008)   Cervical Cancer Screening (HPV/Pap Cotest)  07/15/2024 (Originally 01/10/2016)   INFLUENZA VACCINE  09/28/2023   DTaP/Tdap/Td (2 - Td or Tdap) 01/05/2029   Hepatitis C Screening  Completed   HIV Screening  Completed   HPV VACCINES  Aged Out   Meningococcal B Vaccine  Aged Out    Physical Exam: Vitals:   07/12/23 1258 07/12/23 1520  BP: (!) 160/100 (!) 154/100  Pulse: 85   Resp: 20   Temp: (!) 97.2 F (36.2 C)   SpO2: 94%   Weight: 287 lb 6.4 oz (130.4 kg)   Height: 5' 11.5" (1.816 m)    Body mass index is 39.53 kg/m. Physical Exam Constitutional:      General: She is not in acute distress.    Appearance: She is obese.  HENT:     Head: Normocephalic and atraumatic.     Nose: Nose normal.     Mouth/Throat:     Mouth: Mucous membranes are moist.  Eyes:     Conjunctiva/sclera: Conjunctivae normal.  Cardiovascular:     Rate and Rhythm: Normal rate and regular rhythm.  Pulmonary:     Effort: Pulmonary effort is normal.     Breath sounds: Normal breath sounds.  Abdominal:     General: Bowel sounds are  normal.     Palpations: Abdomen is soft.  Musculoskeletal:        General: Normal range of motion.     Cervical back: Normal range of motion.  Skin:    General: Skin is warm and dry.  Neurological:     General: No focal deficit present.     Mental Status: She is alert and oriented to person, place, and time.  Psychiatric:        Mood and Affect: Mood normal.        Behavior: Behavior normal.        Thought Content: Thought content normal.        Judgment: Judgment normal.     Labs reviewed: Basic Metabolic Panel: Recent Labs    07/16/23 0959  NA 139  K 4.1  CL 106  CO2 26  GLUCOSE 120*  BUN 10  CREATININE 0.67  CALCIUM 9.0  TSH 2.58   Liver Function Tests: Recent Labs    07/16/23 0959  AST 30  ALT 17  BILITOT 0.4  PROT 7.0   No results for input(s): "LIPASE", "AMYLASE" in the last 8760 hours. No results for input(s): "AMMONIA" in the last 8760 hours. CBC: Recent Labs    07/16/23 0959  WBC 3.1*  NEUTROABS 1,200*  HGB 13.3  HCT 40.6  MCV 91.6  PLT 237   Lipid Panel: Recent Labs    07/16/23 0959  CHOL 180  HDL 61  LDLCALC 100*  TRIG 91  CHOLHDL 3.0   Lab Results  Component Value Date   HGBA1C 6.4 (H) 07/16/2023    Procedures since last visit: No results found.  Assessment/Plan  1. Uncontrolled hypertension (Primary) -  Uncontrolled hypertension due to non-adherence to lisinopril -HCTZ and metoprolol . - Reorder lisinopril -HCTZ 20-25 mg daily and metoprolol  XL 50 mg daily. Send prescriptions to [facility] in [place]. - Advise her to resume medications as prescribed. - check BP daily - lisinopril -hydrochlorothiazide  (ZESTORETIC ) 20-25 MG tablet; Take 1 tablet by mouth daily.  Dispense: 90 tablet; Refill: 1 - metoprolol  succinate (TOPROL  XL) 50 MG 24 hr tablet; Take 1 tablet (50 mg total) by mouth daily. Take with or immediately following a meal.  Dispense: 90 tablet; Refill: 1 - CBC with Differential/Platelets; Future - Complete  Metabolic Panel with eGFR; Future - Lipid panel; Future - TSH; Future  2. Prediabetes -  Prediabetes with possible elevated blood sugar levels indicated by symptoms. - Order fasting blood work including A1c. - Advise fasting from midnight before blood draw. - Schedule follow-up in one week to discuss results. - Lipid panel; Future - Hemoglobin A1C; Future - TSH; Future  3. Morbid obesity (HCC) -  Morbid obesity with BMI of 39.53 kg/m, sedentary lifestyle, and suboptimal diet. - Advise 150 minutes of exercise per week. - Recommend low carbohydrate diet.  5. Encounter to establish care -  established care with [facility]  6. Screening for osteoporosis - DG BONE DENSITY (DXA); Future  7. Screening mammogram for breast cancer - MM 3D SCREENING MAMMOGRAM BILATERAL BREAST    Labs/tests ordered:  Mammogram, bone density, lipid panel, A1C, CBC, CMP, lipid panel, TSH       Return in about 1 week (around 07/19/2023).  Devian Bartolomei Medina-Vargas, NP

## 2023-07-16 ENCOUNTER — Other Ambulatory Visit

## 2023-07-16 DIAGNOSIS — R7303 Prediabetes: Secondary | ICD-10-CM | POA: Diagnosis not present

## 2023-07-16 DIAGNOSIS — I10 Essential (primary) hypertension: Secondary | ICD-10-CM

## 2023-07-17 ENCOUNTER — Ambulatory Visit: Payer: Self-pay | Admitting: Adult Health

## 2023-07-17 LAB — CBC WITH DIFFERENTIAL/PLATELET
Absolute Lymphocytes: 1423 {cells}/uL (ref 850–3900)
Absolute Monocytes: 366 {cells}/uL (ref 200–950)
Basophils Absolute: 31 {cells}/uL (ref 0–200)
Basophils Relative: 1 %
Eosinophils Absolute: 81 {cells}/uL (ref 15–500)
Eosinophils Relative: 2.6 %
HCT: 40.6 % (ref 35.0–45.0)
Hemoglobin: 13.3 g/dL (ref 11.7–15.5)
MCH: 30 pg (ref 27.0–33.0)
MCHC: 32.8 g/dL (ref 32.0–36.0)
MCV: 91.6 fL (ref 80.0–100.0)
MPV: 10.5 fL (ref 7.5–12.5)
Monocytes Relative: 11.8 %
Neutro Abs: 1200 {cells}/uL — ABNORMAL LOW (ref 1500–7800)
Neutrophils Relative %: 38.7 %
Platelets: 237 10*3/uL (ref 140–400)
RBC: 4.43 10*6/uL (ref 3.80–5.10)
RDW: 13.6 % (ref 11.0–15.0)
Total Lymphocyte: 45.9 %
WBC: 3.1 10*3/uL — ABNORMAL LOW (ref 3.8–10.8)

## 2023-07-17 LAB — COMPLETE METABOLIC PANEL WITHOUT GFR
AG Ratio: 1.4 (calc) (ref 1.0–2.5)
ALT: 17 U/L (ref 6–29)
AST: 30 U/L (ref 10–35)
Albumin: 4.1 g/dL (ref 3.6–5.1)
Alkaline phosphatase (APISO): 58 U/L (ref 37–153)
BUN: 10 mg/dL (ref 7–25)
CO2: 26 mmol/L (ref 20–32)
Calcium: 9 mg/dL (ref 8.6–10.4)
Chloride: 106 mmol/L (ref 98–110)
Creat: 0.67 mg/dL (ref 0.50–1.05)
Globulin: 2.9 g/dL (ref 1.9–3.7)
Glucose, Bld: 120 mg/dL — ABNORMAL HIGH (ref 65–99)
Potassium: 4.1 mmol/L (ref 3.5–5.3)
Sodium: 139 mmol/L (ref 135–146)
Total Bilirubin: 0.4 mg/dL (ref 0.2–1.2)
Total Protein: 7 g/dL (ref 6.1–8.1)

## 2023-07-17 LAB — HEMOGLOBIN A1C
Hgb A1c MFr Bld: 6.4 % — ABNORMAL HIGH (ref <5.7)
Mean Plasma Glucose: 137 mg/dL
eAG (mmol/L): 7.6 mmol/L

## 2023-07-17 LAB — LIPID PANEL
Cholesterol: 180 mg/dL (ref <200)
HDL: 61 mg/dL (ref >=50)
LDL Cholesterol (Calc): 100 mg/dL — ABNORMAL HIGH
Non-HDL Cholesterol (Calc): 119 mg/dL (ref <130)
Total CHOL/HDL Ratio: 3 (calc) (ref <5.0)
Triglycerides: 91 mg/dL (ref <150)

## 2023-07-17 LAB — TSH: TSH: 2.58 m[IU]/L (ref 0.40–4.50)

## 2023-07-17 NOTE — Progress Notes (Signed)
-     A1c 6.4, up from 6.1 (done 4 years ago), will discuss possible treatment with metformin on next visit -   LDL 100, normal is<100 -   Cholesterol, triglycerides, electrolytes, liver enzymes, TSH normal -   Creatinine 0.67, up with calculated GFR of 98, good -   WBC 3.1,  slightly low, (level 3.8-10.8), will recheck on next visit

## 2023-07-26 ENCOUNTER — Ambulatory Visit (INDEPENDENT_AMBULATORY_CARE_PROVIDER_SITE_OTHER): Admitting: Adult Health

## 2023-07-26 ENCOUNTER — Encounter: Payer: Self-pay | Admitting: Adult Health

## 2023-07-26 VITALS — BP 164/100 | HR 82 | Temp 97.1°F | Ht 71.5 in | Wt 282.8 lb

## 2023-07-26 DIAGNOSIS — I10 Essential (primary) hypertension: Secondary | ICD-10-CM | POA: Diagnosis not present

## 2023-07-26 DIAGNOSIS — E1169 Type 2 diabetes mellitus with other specified complication: Secondary | ICD-10-CM | POA: Diagnosis not present

## 2023-07-26 MED ORDER — METFORMIN HCL 500 MG PO TABS: 500.0000 mg | ORAL_TABLET | Freq: Two times a day (BID) | ORAL | 3 refills | Status: DC

## 2023-07-26 MED ORDER — METOPROLOL SUCCINATE ER 100 MG PO TB24: 100.0000 mg | ORAL_TABLET | Freq: Every day | ORAL | 1 refills | Status: AC

## 2023-07-26 MED ORDER — TIRZEPATIDE 2.5 MG/0.5ML ~~LOC~~ SOAJ
2.5000 mg | SUBCUTANEOUS | 1 refills | Status: AC
Start: 2023-07-26 — End: ?

## 2023-07-26 NOTE — Progress Notes (Signed)
 [facility] clinic  Provider:  Inge Mangle DNP  Code Status:  Full Code  Goals of Care:     04/03/2019    4:00 PM  Advanced Directives  Does Patient Have a Medical Advance Directive? No  Would patient like information on creating a medical advance directive? No - Patient declined     Chief Complaint  Patient presents with   Medical Management of Chronic Issues    Discuss labs results   Discussed the use of AI scribe software for clinical note transcription with the patient, who gave verbal consent to proceed.  HPI: Patient is a 63 y.o. female seen today for follow up of chronic medical issues.  Her blood pressure remains elevated despite adherence to her medication regimen, which includes lisinopril -hydrochlorothiazide  20/25 mg once daily and Toprol  XL 50 mg daily. She took her medication this morning. She acknowledges dietary indiscretions, such as consuming bacon, which may affect her blood pressure. She does not regularly monitor her blood pressure at home and attributes some fluctuations to stress from family interactions. No swelling in her feet or ankles.  She has a history of prediabetes, with her most recent A1c being 6.4%. She is not currently on any medication for diabetes. She eats vegetables and tries to limit rice and bread consumption.  Her weight has decreased from 294 lbs in 2021 to 282 lbs currently, attributed to dietary changes and increased water intake. She wants to lose more weight to improve her overall health and alleviate pressure on her knees.  She denies smoking or alcohol use. She takes ibuprofen  for pain as needed, ensuring to eat with it to avoid gastritis. She has a history of elevated cholesterol but currently maintains good levels through diet without medication.     Past Medical History:  Diagnosis Date   Allergic rhinitis    Hypertension    Obesity    OSA (obstructive sleep apnea) 2006   Dx 7/06 with RDI 145.6 and desat to 62%. CPAP at 9  reduced RDI to 7.1. Repeat testing Aug 2006 showed RDI 0.7 on CPAP of 0.7   Osteoarthritis    Of knees B. Uses hydrocodone sparingly.    Osteoarthritis    Sleep apnea     Past Surgical History:  Procedure Laterality Date   COLONOSCOPY N/A 10/17/2017   TUBAL LIGATION      No Known Allergies  Outpatient Encounter Medications as of 07/26/2023  Medication Sig   Blood Pressure Monitoring (BLOOD PRESSURE CUFF) MISC 1 Device by Does not apply route 2 (two) times daily.   lisinopril -hydrochlorothiazide  (ZESTORETIC ) 20-25 MG tablet Take 1 tablet by mouth daily.   metoprolol  succinate (TOPROL -XL) 100 MG 24 hr tablet Take 1 tablet (100 mg total) by mouth daily. Take with or immediately following a meal.   tirzepatide  (MOUNJARO ) 2.5 MG/0.5ML Pen Inject 2.5 mg into the skin once a week.   [DISCONTINUED] metFORMIN  (GLUCOPHAGE ) 500 MG tablet Take 1 tablet (500 mg total) by mouth 2 (two) times daily with a meal.   [DISCONTINUED] metoprolol  succinate (TOPROL  XL) 50 MG 24 hr tablet Take 1 tablet (50 mg total) by mouth daily. Take with or immediately following a meal.   Facility-Administered Encounter Medications as of 07/26/2023  Medication   0.9 %  sodium chloride  infusion    Review of Systems:  Review of Systems  Constitutional:  Negative for appetite change, chills, fatigue and fever.  HENT:  Negative for congestion, hearing loss, rhinorrhea and sore throat.   Eyes: Negative.  Respiratory:  Negative for cough, shortness of breath and wheezing.   Cardiovascular:  Negative for chest pain, palpitations and leg swelling.  Gastrointestinal:  Negative for abdominal pain, constipation, diarrhea, nausea and vomiting.  Genitourinary:  Negative for dysuria.  Musculoskeletal:  Negative for arthralgias, back pain and myalgias.  Skin:  Negative for color change, rash and wound.  Neurological:  Negative for dizziness, weakness and headaches.  Psychiatric/Behavioral:  Negative for behavioral problems. The  patient is not nervous/anxious.     Health Maintenance  Topic Date Due   Medicare Annual Wellness (AWV)  Never done   Diabetic kidney evaluation - Urine ACR  Never done   MAMMOGRAM  03/19/2008   Colonoscopy  10/18/2022   COVID-19 Vaccine (3 - 2024-25 season) 10/29/2022   DEXA SCAN  Never done   Zoster Vaccines- Shingrix (1 of 2) 10/12/2023 (Originally 03/19/2008)   Pneumonia Vaccine 46+ Years old (1 of 1 - PCV) 07/11/2024 (Originally 03/19/2008)   Cervical Cancer Screening (HPV/Pap Cotest)  07/15/2024 (Originally 01/10/2016)   INFLUENZA VACCINE  09/28/2023   Diabetic kidney evaluation - eGFR measurement  07/15/2024   DTaP/Tdap/Td (2 - Td or Tdap) 01/05/2029   Hepatitis C Screening  Completed   HIV Screening  Completed   HPV VACCINES  Aged Out   Meningococcal B Vaccine  Aged Out    Physical Exam: Vitals:   07/26/23 1420 07/26/23 1450  BP: (!) 164/98 (!) 164/100  Pulse: 82   Temp: (!) 97.1 F (36.2 C)   SpO2: 97%   Weight: 282 lb 12.8 oz (128.3 kg)   Height: 5' 11.5" (1.816 m)    Body mass index is 38.89 kg/m. Physical Exam Constitutional:      Appearance: She is obese.  HENT:     Head: Normocephalic and atraumatic.     Nose: Nose normal.     Mouth/Throat:     Mouth: Mucous membranes are moist.  Eyes:     Conjunctiva/sclera: Conjunctivae normal.  Cardiovascular:     Rate and Rhythm: Normal rate and regular rhythm.  Pulmonary:     Effort: Pulmonary effort is normal.     Breath sounds: Normal breath sounds.  Abdominal:     General: Bowel sounds are normal.     Palpations: Abdomen is soft.  Musculoskeletal:        General: Normal range of motion.     Cervical back: Normal range of motion.  Skin:    General: Skin is warm and dry.  Neurological:     General: No focal deficit present.     Mental Status: She is alert and oriented to person, place, and time.  Psychiatric:        Mood and Affect: Mood normal.        Behavior: Behavior normal.     Labs  reviewed: Basic Metabolic Panel: Recent Labs    07/16/23 0959  NA 139  K 4.1  CL 106  CO2 26  GLUCOSE 120*  BUN 10  CREATININE 0.67  CALCIUM 9.0  TSH 2.58   Liver Function Tests: Recent Labs    07/16/23 0959  AST 30  ALT 17  BILITOT 0.4  PROT 7.0   No results for input(s): "LIPASE", "AMYLASE" in the last 8760 hours. No results for input(s): "AMMONIA" in the last 8760 hours. CBC: Recent Labs    07/16/23 0959  WBC 3.1*  NEUTROABS 1,200*  HGB 13.3  HCT 40.6  MCV 91.6  PLT 237   Lipid Panel: Recent Labs  07/16/23 0959  CHOL 180  HDL 61  LDLCALC 100*  TRIG 91  CHOLHDL 3.0   Lab Results  Component Value Date   HGBA1C 6.4 (H) 07/16/2023    Procedures since last visit: No results found.  Assessment/Plan  1. Uncontrolled hypertension (Primary) -  Blood pressure remains uncontrolled on current regimen. Dietary indiscretions and stress may contribute.  - Increase Toprol  XL to 100 mg daily. - Refer to cardiology for further management. - Instruct to monitor blood pressure at home and record readings. - Advise dietary modifications to reduce sodium intake. - Schedule follow-up in 2 weeks to reassess blood pressure control. - Ambulatory referral to Cardiology - metoprolol  succinate (TOPROL -XL) 100 MG 24 hr tablet; Take 1 tablet (100 mg total) by mouth daily. Take with or immediately following a meal.  Dispense: 90 tablet; Refill: 1  2. Type 2 diabetes mellitus with other specified complication, without long-term current use of insulin (HCC) -  A1c increased to 6.4%, indicating progression to type 2 diabetes. Kidney function normal. Discussed metformin ; she prefers Mounjaro  for weight loss, pending insurance approval. No drug interactions anticipated. - Prescribe Mounjaro  (tirzepatide ) injection once weekly, pending insurance approval. - Advise on dietary modifications to reduce sugar and carbohydrate intake. - Encourage weight loss through diet and  exercise. - Monitor A1c and adjust treatment as needed.  - tirzepatide  (MOUNJARO ) 2.5 MG/0.5ML Pen; Inject 2.5 mg into the skin once a week.  Dispense: 6 mL; Refill: 1  3. Morbid obesity (HCC) Body mass index is 38.89 kg/m. -  will start on Mounjaro  2.5 mg injection weekly - tirzepatide  (MOUNJARO ) 2.5 MG/0.5ML Pen; Inject 2.5 mg into the skin once a week.  Dispense: 6 mL; Refill: 1     Labs/tests ordered:  None   Return if symptoms worsen or fail to improve.  Timmy Bubeck Medina-Vargas, NP

## 2023-07-26 NOTE — Patient Instructions (Signed)

## 2023-07-30 ENCOUNTER — Ambulatory Visit: Admitting: Adult Health

## 2023-07-30 ENCOUNTER — Encounter: Payer: Self-pay | Admitting: Adult Health

## 2023-07-30 VITALS — BP 148/80 | HR 80 | Temp 97.8°F | Ht 71.5 in | Wt 282.8 lb

## 2023-07-30 DIAGNOSIS — E1169 Type 2 diabetes mellitus with other specified complication: Secondary | ICD-10-CM

## 2023-07-30 DIAGNOSIS — Z0000 Encounter for general adult medical examination without abnormal findings: Secondary | ICD-10-CM | POA: Diagnosis not present

## 2023-07-30 DIAGNOSIS — Z13820 Encounter for screening for osteoporosis: Secondary | ICD-10-CM

## 2023-07-30 MED ORDER — SEMAGLUTIDE(0.25 OR 0.5MG/DOS) 2 MG/3ML ~~LOC~~ SOPN: 0.2500 mg | PEN_INJECTOR | SUBCUTANEOUS | 3 refills | Status: AC

## 2023-07-30 MED ORDER — OZEMPIC (0.25 OR 0.5 MG/DOSE) 2 MG/3ML ~~LOC~~ SOPN: PEN_INJECTOR | SUBCUTANEOUS | Status: AC

## 2023-07-30 NOTE — Addendum Note (Signed)
 Addended by: Fredric Jean on: 07/30/2023 12:42 PM   Modules accepted: Orders

## 2023-07-30 NOTE — Progress Notes (Signed)
 Subjective:   Emily Schmitt is a 63 y.o. female who presents for Medicare Annual (Subsequent) preventive examination.  Visit Complete: In person  Patient Medicare AWV questionnaire was completed by the patient on 07/30/23; I have confirmed that all information answered by patient is correct and no changes since this date.  Cardiac Risk Factors include: diabetes mellitus;hypertension;obesity (BMI >30kg/m2)     Objective:     Today's Vitals   07/27/23 1323 07/30/23 1152  BP: (!) 148/80 (!) 148/80  Pulse: 80   Temp: 97.8 F (36.6 C)   SpO2: 97%   Weight: 282 lb 12.8 oz (128.3 kg)   Height: 5' 11.5" (1.816 m)    Body mass index is 38.89 kg/m.     04/03/2019    4:00 PM 01/06/2019    1:45 PM 09/23/2018    1:34 PM 05/09/2018    3:31 PM 08/09/2017    2:38 PM 06/21/2017    4:03 PM 05/03/2016    2:14 PM  Advanced Directives  Does Patient Have a Medical Advance Directive? No No No No No No No  Would patient like information on creating a medical advance directive? No - Patient declined No - Patient declined No - Patient declined No - Patient declined No - Patient declined No - Patient declined No - Patient declined    Current Medications (verified) Outpatient Encounter Medications as of 07/30/2023  Medication Sig   Blood Pressure Monitoring (BLOOD PRESSURE CUFF) MISC 1 Device by Does not apply route 2 (two) times daily.   lisinopril -hydrochlorothiazide  (ZESTORETIC ) 20-25 MG tablet Take 1 tablet by mouth daily.   metoprolol  succinate (TOPROL -XL) 100 MG 24 hr tablet Take 1 tablet (100 mg total) by mouth daily. Take with or immediately following a meal.   [DISCONTINUED] tirzepatide  (MOUNJARO ) 2.5 MG/0.5ML Pen Inject 2.5 mg into the skin once a week.   Facility-Administered Encounter Medications as of 07/30/2023  Medication   0.9 %  sodium chloride  infusion    Allergies (verified) Patient has no known allergies.   History: Past Medical History:  Diagnosis Date   Allergic rhinitis     Hypertension    Obesity    OSA (obstructive sleep apnea) 2006   Dx 7/06 with RDI 145.6 and desat to 62%. CPAP at 11 reduced RDI to 7.1. Repeat testing Aug 2006 showed RDI 0.7 on CPAP of 0.7   Osteoarthritis    Of knees B. Uses hydrocodone sparingly.    Osteoarthritis    Sleep apnea    Past Surgical History:  Procedure Laterality Date   COLONOSCOPY N/A 10/17/2017   TUBAL LIGATION     Family History  Problem Relation Age of Onset   CVA Mother    Diabetes Mother    Stroke Mother    Heart murmur Sister    Diabetes Sister    Diabetes Brother    Hypertension Sister    Hypertension Brother    Heart attack Neg Hx    Colon cancer Neg Hx    Colon polyps Neg Hx    Esophageal cancer Neg Hx    Pancreatic cancer Neg Hx    Rectal cancer Neg Hx    Stomach cancer Neg Hx    Social History   Socioeconomic History   Marital status: Married    Spouse name: Not on file   Number of children: Not on file   Years of education: Not on file   Highest education level: Not on file  Occupational History   Occupation: Stage manager  supervisor    Employer: [employer]  Tobacco Use   Smoking status: Never   Smokeless tobacco: Never  Vaping Use   Vaping status: Never Used  Substance and Sexual Activity   Alcohol use: Yes    Alcohol/week: 0.0 standard drinks of alcohol    Comment: socially   Drug use: No   Sexual activity: Yes  Other Topics Concern   Not on file  Social History Narrative   Not on file   Social Drivers of Health   Financial Resource Strain: Low Risk  (07/30/2023)   Overall Financial Resource Strain (CARDIA)    Difficulty of Paying Living Expenses: Not hard at all  Food Insecurity: No Food Insecurity (07/30/2023)   Hunger Vital Sign    Worried About Running Out of Food in the Last Year: Never true    Ran Out of Food in the Last Year: Never true  Transportation Needs: No Transportation Needs (07/30/2023)   PRAPARE - Administrator, Civil Service  (Medical): No    Lack of Transportation (Non-Medical): No  Physical Activity: Sufficiently Active (07/30/2023)   Exercise Vital Sign    Days of Exercise per Week: 5 days    Minutes of Exercise per Session: 120 min  Stress: No Stress Concern Present (07/30/2023)   [employer] of Occupational Health - Occupational Stress Questionnaire    Feeling of Stress : Not at all  Social Connections: Moderately Integrated (07/30/2023)   Social Connection and Isolation Panel [NHANES]    Frequency of Communication with Friends and Family: More than three times a week    Frequency of Social Gatherings with Friends and Family: More than three times a week    Attends Religious Services: More than 4 times per year    Active Member of [employer] or Organizations: No    Attends Engineer, structural: Never    Marital Status: Married    Tobacco Counseling Counseling given: Not Answered   Clinical Intake:  Pre-visit preparation completed: No  Pain : No/denies pain     BMI - recorded: 38.89 Nutritional Status: BMI > 30  Obese Nutritional Risks: None Diabetes: Yes CBG done?: No Did pt. bring in CBG monitor from home?: No  How often do you need to have someone help you when you read instructions, pamphlets, or other written materials from your doctor or pharmacy?: 2 - Rarely What is the last grade level you completed in school?: Graduated High School  Interpreter Needed?: No  Information entered by :: Arath Kaigler Medina-Vargas DNP   Activities of Daily Living    07/30/2023   12:15 PM  In your present state of health, do you have any difficulty performing the following activities:  Hearing? 0  Vision? 0  Difficulty concentrating or making decisions? 0  Walking or climbing stairs? 0  Dressing or bathing? 0  Doing errands, shopping? 0  Preparing Food and eating ? N  Using the Toilet? N  In the past six months, have you accidently leaked urine? N  Do you have problems with loss of bowel  control? N  Managing your Medications? N  Managing your Finances? N  Housekeeping or managing your Housekeeping? N    Patient Care Team: Medina-Vargas, Darcel Frane C, NP as PCP - General (Internal Medicine) Sydell Eva Anastacia Karst, MD (Inactive) as PCP - Internal Medicine (Internal Medicine)  Indicate any recent Medical Services you may have received from other than [facility] providers in the past year (date may be approximate).  Assessment:    This is a routine wellness examination for Joud.  Hearing/Vision screen Vision Screening   Right eye Left eye Both eyes  Without correction 20/20 20/40 20/25   With correction     Hearing Screening - Comments:: Unable to remember last check no concerns   Goals Addressed             This Visit's Progress    Exercise 3x per week (30 min per time)       -  will start going to [facility] 3X/week       Depression Screen    07/30/2023   11:34 AM 04/03/2019    4:54 PM 01/06/2019    2:51 PM 09/23/2018    1:34 PM 05/09/2018    3:31 PM 08/09/2017    2:37 PM 06/21/2017    4:02 PM  PHQ 2/9 Scores  PHQ - 2 Score 0 0 0 0 0 0 0  PHQ- 9 Score  0 0        Fall Risk    07/30/2023   11:39 AM 04/03/2019    3:59 PM 01/06/2019    1:45 PM 09/23/2018    1:33 PM 05/09/2018    3:31 PM  Fall Risk   Falls in the past year? 0 0 0 0 0  Number falls in past yr:  0   0  Risk for fall due to :   History of fall(s) History of fall(s)   Follow up    Falls prevention discussed     MEDICARE RISK AT HOME: Medicare Risk at Home Any stairs in or around the home?: Yes If so, are there any without handrails?: No Home free of loose throw rugs in walkways, pet beds, electrical cords, etc?: Yes Adequate lighting in your home to reduce risk of falls?: Yes Life alert?: No Use of a cane, walker or w/c?: No Grab bars in the bathroom?: Yes Shower chair or bench in shower?: No Elevated toilet seat or a handicapped toilet?: No  TIMED UP AND GO:  Was the test performed?  Yes  Length of  time to ambulate 10 feet: <5 sec Gait steady and fast without use of assistive device    Cognitive Function:        07/30/2023   11:39 AM  6CIT Screen  What Year? 0 points  What month? 0 points  What time? 0 points  Count back from 20 0 points  Months in reverse 0 points  Repeat phrase 2 points  Total Score 2 points    Immunizations Immunization History  Administered Date(s) Administered   Influenza,inj,Quad PF,6+ Mos 05/13/2015, 01/06/2019   Moderna Sars-Covid-2 Vaccination 05/15/2019, 06/17/2019   Tdap 01/06/2019    TDAP status: Up to date  Flu Vaccine status: Up to date  Pneumococcal vaccine status: Due, Education has been provided regarding the importance of this vaccine. Advised may receive this vaccine at local pharmacy or Health Dept. Aware to provide a copy of the vaccination record if obtained from local pharmacy or Health Dept. Verbalized acceptance and understanding.  Covid-19 vaccine status: Information provided on how to obtain vaccines.   Qualifies for Shingles Vaccine? Yes   Zostavax completed No   Shingrix Completed?: No.    Education has been provided regarding the importance of this vaccine. Patient has been advised to call insurance company to determine out of pocket expense if they have not yet received this vaccine. Advised may also receive vaccine at local pharmacy or Health Dept. Verbalized  acceptance and understanding.  Screening Tests Health Maintenance  Topic Date Due   Medicare Annual Wellness (AWV)  Never done   Diabetic kidney evaluation - Urine ACR  Never done   MAMMOGRAM  03/19/2008   COVID-19 Vaccine (3 - 2024-25 season) 10/29/2022   DEXA SCAN  Never done   Zoster Vaccines- Shingrix (1 of 2) 10/12/2023 (Originally 03/19/2008)   Pneumonia Vaccine 72+ Years old (1 of 1 - PCV) 07/11/2024 (Originally 03/19/2008)   Cervical Cancer Screening (HPV/Pap Cotest)  07/15/2024 (Originally 01/10/2016)   INFLUENZA VACCINE  09/28/2023   Diabetic kidney  evaluation - eGFR measurement  07/15/2024   DTaP/Tdap/Td (2 - Td or Tdap) 01/05/2029   Hepatitis C Screening  Completed   HIV Screening  Completed   HPV VACCINES  Aged Out   Meningococcal B Vaccine  Aged Out   Colonoscopy  Discontinued    Health Maintenance  Health Maintenance Due  Topic Date Due   Medicare Annual Wellness (AWV)  Never done   Diabetic kidney evaluation - Urine ACR  Never done   MAMMOGRAM  03/19/2008   COVID-19 Vaccine (3 - 2024-25 season) 10/29/2022   DEXA SCAN  Never done    Colorectal cancer screening: Type of screening: Colonoscopy. Completed 10/17/2017. Repeat every 10 years  Mammogram status: Ordered 07/12/23. Pt provided with contact info and advised to call to schedule appt.   Bone Density status: Ordered 07/30/23. Pt provided with contact info and advised to call to schedule appt.  Lung Cancer Screening: (Low Dose CT Chest recommended if Age 86-80 years, 20 pack-year currently smoking OR have quit w/in 15years.) does not qualify.   Lung Cancer Screening Referral: N/A  Additional Screening:  Hepatitis C Screening: does qualify; Completed 05/13/2015  Vision Screening: Recommended annual ophthalmology exams for early detection of glaucoma and other disorders of the eye. Is the patient up to date with their annual eye exam?  Yes  Who is the provider or what is the name of the office in which the patient attends annual eye exams? [facility] If pt is not established with a provider, would they like to be referred to a provider to establish care? No .   Dental Screening: Recommended annual dental exams for proper oral hygiene  Diabetic Foot Exam: Diabetic Foot Exam: Completed 07/12/23  Community Resource Referral / Chronic Care Management: CRR required this visit?  No   CCM required this visit?  No     Plan:     I have personally reviewed and noted the following in the patient's chart:   Medical and social history Use of alcohol, tobacco or  illicit drugs  Current medications and supplements including opioid prescriptions. Patient is not currently taking opioid prescriptions. Functional ability and status Nutritional status Physical activity Advanced directives List of other physicians Hospitalizations, surgeries, and ER visits in previous 12 months Vitals Screenings to include cognitive, depression, and falls Referrals and appointments  In addition, I have reviewed and discussed with patient certain preventive protocols, quality metrics, and best practice recommendations. A written personalized care plan for preventive services as well as general preventive health recommendations were provided to patient.     Alisha Bacus Medina-Vargas, NP   07/30/2023   After Visit Summary: (In Person-Printed) AVS printed and given to the patient  Nurse Notes:  Needs to be done annually.  [facility] [address] [place], [place] [address]. [place] . Phone [phone] Fax [phone]

## 2023-08-13 ENCOUNTER — Ambulatory Visit: Admitting: Adult Health

## 2023-08-20 ENCOUNTER — Telehealth: Payer: Self-pay | Admitting: Pharmacist

## 2023-08-20 ENCOUNTER — Ambulatory Visit (INDEPENDENT_AMBULATORY_CARE_PROVIDER_SITE_OTHER): Admitting: Adult Health

## 2023-08-20 ENCOUNTER — Encounter: Payer: Self-pay | Admitting: Adult Health

## 2023-08-20 VITALS — BP 142/88 | HR 88 | Temp 96.1°F | Resp 18 | Ht 71.5 in | Wt 279.6 lb

## 2023-08-20 DIAGNOSIS — E1169 Type 2 diabetes mellitus with other specified complication: Secondary | ICD-10-CM

## 2023-08-20 DIAGNOSIS — Z6837 Body mass index (BMI) 37.0-37.9, adult: Secondary | ICD-10-CM

## 2023-08-20 DIAGNOSIS — R7303 Prediabetes: Secondary | ICD-10-CM

## 2023-08-20 DIAGNOSIS — I10 Essential (primary) hypertension: Secondary | ICD-10-CM

## 2023-08-20 DIAGNOSIS — G8929 Other chronic pain: Secondary | ICD-10-CM

## 2023-08-20 NOTE — Progress Notes (Signed)
   08/20/2023  Patient ID: Emily Schmitt, female   DOB: 1960/03/14, 63 y.o.   MRN: [id number]  Patient was called regarding diabetes management and help with getting a blood glucose meter and supplies.  Unfortunately, Patient did not answer her phone. HIPAA compliant message was left on her voicemail.  From initial research, it looks like the Patient's plan pays for One Touch brand supplies.  A1c-6.4 -Ozempic  on medication list but unsure if Patient is taking LDL 100 mg/dl Patient is not on statin therapy.--Will need to start statin therapy if there are no contraindications.   Plan: Call Patient back in 3-5 business days   Emily Schmitt, PharmD, [facility] Clinical Pharmacist [phone]

## 2023-08-20 NOTE — Progress Notes (Signed)
 Emily Schmitt [facility] clinic  Provider:  Jereld Serum DNP  Code Status:  Full Code  Goals of Care:     04/03/2019    4:00 PM  Advanced Directives  Does Patient Have a Medical Advance Directive? No  Would patient like information on creating a medical advance directive? No - Patient declined     Chief Complaint  Patient presents with   Medical Management of Chronic Issues    2 week follow-up. Patient stated that she did not check her BP because she was coming to the office and she did take her medication at 9 am.    Discussed the use of AI scribe software for clinical note transcription with the patient, who gave verbal consent to proceed.  HPI: Patient is a 63 y.o. female seen today for a 2-week follow up of chronic medical issues.  Her blood pressure has improved to 142/88 mmHg from a previous reading of 164/98 mmHg on May 29. She has not been consistently monitoring her blood pressure at home but suspects it was elevated during her son's wedding anniversary party. She is currently taking lisinopril -hydrochlorothiazide  20-25 mg daily and metoprolol  succinate 100 mg daily.  Her A1c is 6.4%. She is on semaglutide  0.25 mg injection weekly and has lost weight, now weighing 279 lbs, down from 282 lbs on May 29. She is experiencing financial difficulties affording her glucose monitoring supplies and is awaiting insurance coverage confirmation for her medication. She is engaging in more physical activity, including walking, and plans to start exercising at the [facility].  No current knee pain or swelling, and her pain level is zero on a scale of 0 to 10.  She drinks wine occasionally, about once a month, and does not smoke. She is awaiting a call for her bone density test and mammogram appointments.    Past Medical History:  Diagnosis Date   Allergic rhinitis    Hypertension    Obesity    OSA (obstructive sleep apnea) 2006   Dx 7/06 with RDI 145.6 and desat to 62%. CPAP at 11 reduced RDI to  7.1. Repeat testing Aug 2006 showed RDI 0.7 on CPAP of 0.7   Osteoarthritis    Of knees B. Uses hydrocodone sparingly.    Osteoarthritis    Sleep apnea     Past Surgical History:  Procedure Laterality Date   COLONOSCOPY N/A 10/17/2017   TUBAL LIGATION      No Known Allergies  Outpatient Encounter Medications as of 08/20/2023  Medication Sig   Blood Pressure Monitoring (BLOOD PRESSURE CUFF) MISC 1 Device by Does not apply route 2 (two) times daily.   lisinopril -hydrochlorothiazide  (ZESTORETIC ) 20-25 MG tablet Take 1 tablet by mouth daily.   metoprolol  succinate (TOPROL -XL) 100 MG 24 hr tablet Take 1 tablet (100 mg total) by mouth daily. Take with or immediately following a meal.   Semaglutide ,0.25 or 0.5MG /DOS, (OZEMPIC , 0.25 OR 0.5 MG/DOSE,) 2 MG/3ML SOPN See other listing for exact dose   Semaglutide ,0.25 or 0.5MG /DOS, 2 MG/3ML SOPN Inject 0.25 mg into the skin once a week. (Patient not taking: Reported on 08/20/2023)   Facility-Administered Encounter Medications as of 08/20/2023  Medication   0.9 %  sodium chloride  infusion    Review of Systems:  Review of Systems  Constitutional:  Negative for appetite change, chills, fatigue and fever.  HENT:  Negative for congestion, hearing loss, rhinorrhea and sore throat.   Eyes: Negative.   Respiratory:  Negative for cough, shortness of breath and wheezing.   Cardiovascular:  Negative for chest pain, palpitations and leg swelling.  Gastrointestinal:  Negative for abdominal pain, constipation, diarrhea, nausea and vomiting.  Genitourinary:  Negative for dysuria.  Musculoskeletal:  Negative for arthralgias, back pain and myalgias.  Skin:  Negative for color change, rash and wound.  Neurological:  Negative for dizziness, weakness and headaches.  Psychiatric/Behavioral:  Negative for behavioral problems. The patient is not nervous/anxious.     Health Maintenance  Topic Date Due   Diabetic kidney evaluation - Urine ACR  Never done    MAMMOGRAM  03/19/2008   DEXA SCAN  Never done   COVID-19 Vaccine (3 - 2024-25 season) 09/28/2023 (Originally 10/29/2022)   Zoster Vaccines- Shingrix (1 of 2) 10/12/2023 (Originally 03/19/2008)   Pneumococcal Vaccine: 50+ Years (1 of 2 - PCV) 07/11/2024 (Originally 03/19/1977)   Cervical Cancer Screening (HPV/Pap Cotest)  07/15/2024 (Originally 01/10/2016)   INFLUENZA VACCINE  09/28/2023   Diabetic kidney evaluation - eGFR measurement  07/15/2024   Medicare Annual Wellness (AWV)  08/19/2024   DTaP/Tdap/Td (2 - Td or Tdap) 01/05/2029   Hepatitis C Screening  Completed   HIV Screening  Completed   Hepatitis B Vaccines  Aged Out   HPV VACCINES  Aged Out   Meningococcal B Vaccine  Aged Out   Colonoscopy  Discontinued    Physical Exam: Vitals:   08/20/23 1056  BP: (!) 142/88  Pulse: 88  Resp: 18  Temp: (!) 96.1 F (35.6 C)  SpO2: 94%  Weight: 279 lb 9.6 oz (126.8 kg)  Height: 5' 11.5 (1.816 m)   Body mass index is 38.45 kg/m. Physical Exam Constitutional:      Appearance: She is obese.  HENT:     Head: Normocephalic and atraumatic.     Nose: Nose normal.     Mouth/Throat:     Mouth: Mucous membranes are moist.   Eyes:     Conjunctiva/sclera: Conjunctivae normal.    Cardiovascular:     Rate and Rhythm: Normal rate and regular rhythm.  Pulmonary:     Effort: Pulmonary effort is normal.     Breath sounds: Normal breath sounds.  Abdominal:     General: Bowel sounds are normal.     Palpations: Abdomen is soft.   Musculoskeletal:        General: Normal range of motion.     Cervical back: Normal range of motion.   Skin:    General: Skin is warm and dry.   Neurological:     General: No focal deficit present.     Mental Status: She is alert and oriented to person, place, and time.   Psychiatric:        Mood and Affect: Mood normal.        Behavior: Behavior normal.        Thought Content: Thought content normal.        Judgment: Judgment normal.     Labs  reviewed: Basic Metabolic Panel: Recent Labs    07/16/23 0959  NA 139  K 4.1  CL 106  CO2 26  GLUCOSE 120*  BUN 10  CREATININE 0.67  CALCIUM 9.0  TSH 2.58   Liver Function Tests: Recent Labs    07/16/23 0959  AST 30  ALT 17  BILITOT 0.4  PROT 7.0   No results for input(s): LIPASE, AMYLASE in the last 8760 hours. No results for input(s): AMMONIA in the last 8760 hours. CBC: Recent Labs    07/16/23 0959  WBC 3.1*  NEUTROABS 1,200*  HGB 13.3  HCT 40.6  MCV 91.6  PLT 237   Lipid Panel: Recent Labs    07/16/23 0959  CHOL 180  HDL 61  LDLCALC 100*  TRIG 91  CHOLHDL 3.0   Lab Results  Component Value Date   HGBA1C 6.4 (H) 07/16/2023    Procedures since last visit: No results found.  Assessment/Plan  1. Type 2 diabetes mellitus with other specified complication, without long-term current use of insulin (HCC) -  A1c at 6.4%. On semaglutide  with weight loss from 282 lbs to 279 lbs. Financial difficulties obtaining glucose monitoring supplies. Insurance coverage for semaglutide  pending. - Advise follow-up with insurance for glucose monitoring supplies and Ozempic  coverage. - Seek community assistance for glucose monitoring supplies. - Continue semaglutide  injections. - Recheck A1c in three months. - AMB Referral VBCI Care Management  2. Essential hypertension (Primary) -  Blood pressure improved to 142/88 mmHg from 164/98 mmHg. On lisinopril , hydrochlorothiazide , and metoprolol . Inconsistent home monitoring. Attempting lifestyle modifications. - Advise regular home blood pressure monitoring. - Instruct to report high readings. - Continue current antihypertensive medications.  3. Morbid obesity -   Encouraged continued physical activity and weight loss. -  continue Ozempic      Labs/tests ordered:  None   Return in about 3 months (around 11/20/2023).  Emily Hazell Medina-Vargas, NP

## 2023-08-21 ENCOUNTER — Telehealth: Payer: Self-pay

## 2023-08-21 NOTE — Progress Notes (Signed)
 Complex Care Management Note Care Guide Note  08/21/2023 Name: Emily Schmitt MRN: [id number] DOB: 02-02-1961   Complex Care Management Outreach Attempts: An unsuccessful telephone outreach was attempted today to offer the patient information about available complex care management services. Left message on voicemail for patient to return my call for assistance with any community resources. Also informed patient Emily Schmitt, PharmD attempted to call regarding assistance with getting glucose meter and supplies gave number.   Follow Up Plan:  Additional outreach attempts will be made to offer the patient complex care management information and services.   Encounter Outcome:  No Answer  Emily Schmitt [facility]  [facility] Guide Direct Dial: [phone]  Fax: [phone] Website: [email or web address]

## 2023-08-22 ENCOUNTER — Telehealth: Payer: Self-pay | Admitting: Pharmacist

## 2023-08-22 DIAGNOSIS — R7303 Prediabetes: Secondary | ICD-10-CM

## 2023-08-22 NOTE — Progress Notes (Signed)
   08/22/2023  Patient ID: Emily Schmitt, female   DOB: 03-12-1960, 63 y.o.   MRN: [id number]  Patient was called per referral in reference to blood glucose monitoring supplies and diabetes management. Unfortunately, she did not answer her phone. HIPAA compliant message was left on her voicemail.  From initial research, it looks like the Patient's plan pays for One Touch brand supplies.   A1c-6.4 -Ozempic  on medication list but unsure if Patient is taking LDL 100 mg/dl Patient is not on statin therapy.--Will need to start statin therapy if there are no contraindications.   Today's call was the 2nd unsuccessful phone call.  Plan: Call Patient back in 5-7 business days  Cassius DOROTHA Brought, PharmD, [facility] Clinical Pharmacist [phone]

## 2023-08-23 ENCOUNTER — Telehealth: Payer: Self-pay

## 2023-08-23 NOTE — Progress Notes (Signed)
 Complex Care Management Note Care Guide Note  08/23/2023 Name: Emily Schmitt MRN: [id number] DOB: 1961/02/17   Complex Care Management Outreach Attempts: A second unsuccessful outreach was attempted today to offer the patient with information about available complex care management services.  Follow Up Plan:  Additional outreach attempts will be made to offer the patient complex care management information and services.   Encounter Outcome:  No Answer  Antolin Belsito [facility]  [facility] Guide Direct Dial: [phone]  Fax: [phone] Website: [email or web address]

## 2023-08-24 ENCOUNTER — Telehealth: Payer: Self-pay

## 2023-08-24 NOTE — Progress Notes (Signed)
 Complex Care Management Note Care Guide Note  08/24/2023 Name: Emily Schmitt MRN: [id number] DOB: [birth year]   Complex Care Management Outreach Attempts: A third unsuccessful outreach was attempted today to offer the patient with information about available complex care management services.  Follow Up Plan:  No further outreach attempts will be made at this time. We have been unable to contact the patient to offer or enroll patient in complex care management services.  Encounter Outcome:  No Answer  Chantrell Apsey [facility]  [facility] Guide Direct Dial: [phone]  Fax: [phone] Website: [email or web address]

## 2023-09-03 ENCOUNTER — Telehealth: Payer: Self-pay | Admitting: Pharmacist

## 2023-09-03 DIAGNOSIS — R7303 Prediabetes: Secondary | ICD-10-CM

## 2023-09-03 NOTE — Progress Notes (Signed)
   09/03/2023  Patient ID: Emily Schmitt, female   DOB: [birth year], 63 y.o.   MRN: [id number]  Patient was called regarding medication management for her blood sugars and to get her blood glucose supplies.  Unfortunately, she did not answer her phone. HIPAA compliant message was left on her voicemail.  Plan: Call Patient back in 5-7 business days.  A1c 6.4%  Meter supplies covered by her insurance:  [employer].  Emily Schmitt, PharmD, BCACP Clinical Pharmacist [phone]

## 2023-09-12 ENCOUNTER — Telehealth: Payer: Self-pay | Admitting: Pharmacist

## 2023-09-12 DIAGNOSIS — R7303 Prediabetes: Secondary | ICD-10-CM

## 2023-09-12 NOTE — Progress Notes (Signed)
   09/12/2023  Patient ID: Emily Schmitt, female   DOB: 05-03-1960, 63 y.o.   MRN: [id number]  Patient was called per referral in reference to blood glucose monitoring supplies and diabetes management. Unfortunately, she did not answer her phone. HIPAA compliant message was left on her voicemail.  From initial research, it looks like the Patient's plan pays for [employer] brand supplies.   A1c-6.4 -Ozempic  on medication list but unsure if Patient is taking LDL 100 mg/dl Patient is not on statin therapy.   The 10-year ASCVD risk score (Arnett DK, et al., 2019) is: 23%   Values used to calculate the score:     Age: 54 years     Clincally relevant sex: Female     Is Non-Hispanic African American: Yes     Diabetic: Yes     Tobacco smoker: No     Systolic Blood Pressure: 142 mmHg     Is BP treated: Yes     HDL Cholesterol: 61 mg/dL     Total Cholesterol: 180 mg/dL  Today's call is the 4th unsuccessful phone call.  Patient's case will be closed. I will gladly reopen per provider request or a call back from the Patient.   Cassius DOROTHA Brought, PharmD, BCACP Clinical Pharmacist [phone]

## 2023-11-19 ENCOUNTER — Encounter: Admitting: Orthopedic Surgery

## 2023-11-19 NOTE — Progress Notes (Signed)
 This encounter was created in error - please disregard.

## 2024-08-04 ENCOUNTER — Encounter: Payer: Self-pay | Admitting: Adult Health
